# Patient Record
Sex: Female | Born: 1985 | Race: White | Hispanic: No | Marital: Married | Smoking: Never smoker
Health system: Southern US, Community
[De-identification: ages and names within clinical notes are randomized; demographics above are authoritative.]

## PROBLEM LIST (undated history)

## (undated) DIAGNOSIS — Z789 Other specified health status: Secondary | ICD-10-CM

## (undated) DIAGNOSIS — Z9889 Other specified postprocedural states: Secondary | ICD-10-CM

## (undated) HISTORY — PX: NECK SURGERY: SHX720

---

## 2010-11-01 ENCOUNTER — Encounter: Payer: Self-pay | Admitting: Emergency Medicine

## 2010-11-01 ENCOUNTER — Inpatient Hospital Stay (INDEPENDENT_AMBULATORY_CARE_PROVIDER_SITE_OTHER)
Admission: RE | Admit: 2010-11-01 | Discharge: 2010-11-01 | Disposition: A | Discharge status: Home / Self Care | Payer: BC Managed Care – PPO | Source: Ambulatory Visit | Attending: Emergency Medicine | Admitting: Emergency Medicine

## 2010-11-01 DIAGNOSIS — J069 Acute upper respiratory infection, unspecified: Secondary | ICD-10-CM

## 2010-11-01 LAB — CONVERTED CEMR LAB: Rapid Strep: NEGATIVE

## 2011-02-21 ENCOUNTER — Encounter (INDEPENDENT_AMBULATORY_CARE_PROVIDER_SITE_OTHER): Payer: Self-pay | Admitting: *Deleted

## 2011-02-21 ENCOUNTER — Inpatient Hospital Stay
Admission: RE | Admit: 2011-02-21 | Discharge: 2011-02-21 | Disposition: A | Discharge status: Home / Self Care | Payer: BC Managed Care – PPO | Source: Ambulatory Visit | Attending: Family Medicine | Admitting: Family Medicine

## 2011-02-21 LAB — CONVERTED CEMR LAB: Rapid Strep: NEGATIVE

## 2011-05-26 NOTE — Progress Notes (Signed)
Summary: COUGH,CONGESTION,SORE THROAT/TJ rm 5   Vital Signs:  Patient Profile:   25 Years Old Female CC:      Cold & URI symptoms x 5 days Weight:      139.50 pounds O2 Sat:      99 % O2 treatment:    Room Air Temp:     98.5 degrees F oral Pulse rate:   77 / minute Resp:     18 per minute BP sitting:   110 / 72  (left arm) Cuff size:   regular  Vitals Entered By: Clemens Catholic LPN (Nov 01, 2010 5:17 PM)                  Updated Prior Medication List: LOESTRIN 1/20 (21) 1-20 MG-MCG TABS (NORETHINDRONE ACET-ETHINYL EST)   Current Allergies: No known allergies History of Present Illness History from: patient Chief Complaint: Cold & URI symptoms x 5 days History of Present Illness: 25 Years Old Female complains of onset of cold symptoms for 6 days.  Kirsten Odom has been using lots of OTC cough & cold meds which is helping a little bit. +sore throat/hoarseness + cough No pleuritic pain No wheezing + nasal congestion + post-nasal drainage +sinus pain/pressure No chest congestion No itchy/red eyes No earache No hemoptysis No SOB No chills/sweats No fever No nausea No vomiting No abdominal pain No diarrhea No skin rashes + fatigue No myalgias No headache   REVIEW OF SYSTEMS Constitutional Symptoms      Denies fever, chills, night sweats, weight loss, weight gain, and fatigue.  Eyes       Complains of eye pain.      Denies change in vision, eye discharge, glasses, contact lenses, and eye surgery. Ear/Nose/Throat/Mouth       Complains of ear pain, frequent runny nose, sore throat, and hoarseness.      Denies hearing loss/aids, change in hearing, ear discharge, dizziness, frequent nose bleeds, sinus problems, and tooth pain or bleeding.  Respiratory       Complains of dry cough.      Denies productive cough, wheezing, shortness of breath, asthma, bronchitis, and emphysema/COPD.  Cardiovascular       Complains of tires easily with exhertion.      Denies murmurs  and chest pain.    Gastrointestinal       Complains of stomach pain.      Denies nausea/vomiting, diarrhea, constipation, blood in bowel movements, and indigestion. Genitourniary       Denies painful urination, kidney stones, and loss of urinary control. Neurological       Denies paralysis, seizures, and fainting/blackouts. Musculoskeletal       Complains of muscle pain and joint pain.      Denies joint stiffness, decreased range of motion, redness, swelling, muscle weakness, and gout.  Skin       Denies bruising, unusual mles/lumps or sores, and hair/skin or nail changes.  Psych       Denies mood changes, temper/anger issues, anxiety/stress, speech problems, depression, and sleep problems. Other Comments: Pt c/o sore throat, nasal congestion, hoarseness and ears congested x 5 days. no fever.  she has taken dayquil, nyquil, pseudofed,  and IBF.    Past History:  Past Medical History: Unremarkable  Past Surgical History: Denies surgical history  Family History: none  Social History: Never Smoked Alcohol use-no Drug use-no Smoking Status:  never Drug Use:  no Physical Exam General appearance: well developed, well nourished, no acute distress Ears: normal, no  lesions or deformities Nasal: mucosa pink, nonedematous, no septal deviation, turbinates normal Oral/Pharynx: tongue normal, posterior pharynx without erythema or exudate Chest/Lungs: no rales, wheezes, or rhonchi bilateral, breath sounds equal without effort Heart: regular rate and  rhythm, no murmur MSE: oriented to time, place, and person Assessment New Problems: UPPER RESPIRATORY INFECTION, ACUTE (ICD-465.9)   Plan New Medications/Changes: CHERATUSSIN AC 100-10 MG/5ML SYRP (GUAIFENESIN-CODEINE) 5cc q6 hrs as needed for cough  #4oz x 0, 11/01/2010, Hoyt Koch MD PREDNISONE (PAK) 10 MG TABS (PREDNISONE) 6 day pack, use as directed  #1 x 0, 11/01/2010, Hoyt Koch MD  New Orders: New Patient Level  III [phone] Pulse Oximetry (single measurment) [94760] Planning Comments:   1)  No antibiotic since likely viral.  Rapid strep negative.  No culture is done. 2)  Use nasal saline solution (over the counter) at least 3 times a day. 3)  Use over the counter decongestants like Zyrtec-D every 12 hours as needed to help with congestion. 4)  Can take tylenol every 6 hours or motrin every 8 hours for pain or fever. 5)  Follow up with your primary doctor  if no improvement in 5-7 days, sooner if increasing pain, fever, or new symptoms.    The patient and/or caregiver has been counseled thoroughly with regard to medications prescribed including dosage, schedule, interactions, rationale for use, and possible side effects and they verbalize understanding.  Diagnoses and expected course of recovery discussed and will return if not improved as expected or if the condition worsens. Patient and/or caregiver verbalized understanding.  Prescriptions: CHERATUSSIN AC 100-10 MG/5ML SYRP (GUAIFENESIN-CODEINE) 5cc q6 hrs as needed for cough  #4oz x 0   Entered and Authorized by:   Hoyt Koch MD   Signed by:   Hoyt Koch MD on 11/01/2010   Method used:   Print then Give to Patient   RxID:   [id number] PREDNISONE (PAK) 10 MG TABS (PREDNISONE) 6 day pack, use as directed  #1 x 0   Entered and Authorized by:   Hoyt Koch MD   Signed by:   Hoyt Koch MD on 11/01/2010   Method used:   Print then Give to Patient   RxID:   [id number]   Orders Added: 1)  New Patient Level III [29528] 2)  Pulse Oximetry (single measurment) [41324]    Laboratory Results  Date/Time Received: Nov 01, 2010 5:32 PM Date/Time Reported: Nov 01, 2010 5:32 PM   Other Tests  Rapid Strep: negative  Kit Test Internal QC: Negative   (Normal Range: Negative)

## 2011-05-26 NOTE — Assessment & Plan Note (Signed)
Summary: STREP TEST...WSE  Nurse Visit   Vitals Entered By: Clemens Catholic LPN (February 21, 2011 6:44 PM)  Allergies: No Known Drug Allergies Laboratory Results  Date/Time Received: February 21, 2011 6:44 PM  Date/Time Reported: February 21, 2011 6:44 PM   Other Tests  Rapid Strep: negative Comments: The pt came in today requesting a strep test. Advised the pt that her strep test was negative, gave her a copy of OTC med sheet handout, and advised if she fails to improve or worsens she should return to the clinic for an office visit. pt agrees.  Kit Test Internal QC: Negative   (Normal Range: Negative)

## 2014-06-23 NOTE — L&D Delivery Note (Signed)
Delivery Note At 6:46 AM a viable female was delivered via Vaginal, Spontaneous Delivery (Presentation: ;  ).  APGAR: 9, 9; weight  .   Placenta status: Intact, Spontaneous.  Cord: 3 vessels with the following complications: None.  Cord pH: not sent  Anesthesia: Epidural  Episiotomy:  none Lacerations: 2nd degree;Vaginal Suture Repair: 3.0 vicryl rapide Est. Blood Loss (mL): 550     Le lat vag lac + sec deg perineal lac repaired Mom to postpartum.  Baby to Couplet care / Skin to Skin.  Kirsten Odom,Kirsten Odom 04/14/2015, 7:19 AM

## 2014-09-10 ENCOUNTER — Ambulatory Visit (INDEPENDENT_AMBULATORY_CARE_PROVIDER_SITE_OTHER): Payer: BC Managed Care – PPO | Admitting: Family Medicine

## 2014-09-10 VITALS — BP 120/80 | HR 107 | Temp 98.3°F | Resp 16 | Ht 69.0 in | Wt 137.0 lb

## 2014-09-10 DIAGNOSIS — R112 Nausea with vomiting, unspecified: Secondary | ICD-10-CM | POA: Diagnosis not present

## 2014-09-10 DIAGNOSIS — O210 Mild hyperemesis gravidarum: Secondary | ICD-10-CM | POA: Insufficient documentation

## 2014-09-10 MED ORDER — DOXYLAMINE-PYRIDOXINE 10-10 MG PO TBEC: 2.0000 | DELAYED_RELEASE_TABLET | Freq: Every evening | ORAL | Status: DC | PRN

## 2014-09-10 NOTE — Patient Instructions (Signed)

## 2014-09-10 NOTE — Progress Notes (Signed)
Chief Complaint:  Chief Complaint  Patient presents with  . Nausea    Morning sickness    HPI: Kirsten Odom is a 29 y.o. female who is here for  All day morning sickness, she is [redacted] weeks pregnant.  She has tried everything over-the-counter and has googled many different things to see if it would help her with her nausea.  Is unable to see a OB/GYN at this time until [redacted] weeks along.  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 She has tried vitamin b6, ginger, and nothing is working Drinking a lot of fluids. Mostly water she will have gatorade for sugar once in a while. She does not throw up water. Any type of solid food makes her sick. Vomitus about 3-5 times a day.   History reviewed. No pertinent past medical history. History reviewed. No pertinent past surgical history. History   Social History  . Marital Status: Married    Spouse Name: N/A  . Number of Children: N/A  . Years of Education: N/A   Social History Main Topics  . Smoking status: Never Smoker   . Smokeless tobacco: Not on file  . Alcohol Use: Not on file  . Drug Use: Not on file  . Sexual Activity: Not on file   Other Topics Concern  . None   Social History Narrative  . None   Family History  Problem Relation Age of Onset  . Hyperlipidemia Father   . Stroke Maternal Grandmother   . Stroke Maternal Grandfather   . Heart disease Maternal Grandfather   . Diabetes Maternal Grandfather    No Known Allergies Prior to Admission medications   Medication Sig Start Date End Date Taking? Authorizing Provider  Prenatal Multivit-Min-Fe-FA (PRENATAL VITAMINS PO) Take by mouth.   Yes Historical Provider, MD     ROS: The patient denies fevers, chills, night sweats, unintentional weight loss, chest pain, palpitations, wheezing, dyspnea on exertion, abdominal pain, dysuria,  hematuria, melena, numbness, weakness, or tingling.   All other systems have been reviewed and were otherwise negative with the exception of those mentioned in the HPI and as above.    PHYSICAL EXAM: Filed Vitals:   09/10/14 1543  BP: 120/80  Pulse: 107  Temp: 98.3 F (36.8 C)  Resp: 16   Filed Vitals:   09/10/14 1543  Height: 5\' 9"  (1.753 m)  Weight: 137 lb (62.143 kg)   Body mass index is 20.22 kg/(m^2).  General: Alert, no acute distress HEENT:  Normocephalic, atraumatic, oropharynx patent. EOMI, PERRLA Cardiovascular:  Regular rate and rhythm, no rubs murmurs or gallops.  No Carotid bruits, radial pulse intact. No pedal edema.  Respiratory: Clear to auscultation bilaterally.  No wheezes, rales, or rhonchi.  No cyanosis, no use of accessory musculature GI: No organomegaly, abdomen is soft and non-tender, positive bowel sounds.  No masses. Skin: No rashes. Neurologic: Facial musculature symmetric. Psychiatric: Patient is appropriate throughout our interaction. Lymphatic: No cervical lymphadenopathy Musculoskeletal: Gait intact.   LABS: Results for orders placed or performed in visit on 02/21/11  [facility]  Result Value Ref Range   Rapid Strep negative      EKG/XRAY:   Primary [facility]   ASSESSMENT/PLAN: Encounter Diagnoses  Name Primary?  . Morning sickness Yes  .  Nausea and vomiting, vomiting of unspecified type    29 year old female G1 A0 L0 is 7 weeks and 0 days pregnant. She has nausea and vomiting. His tried everything over-the-counter. Prescribe Diclegis for when necessary use, we went over the side effect profiles of the medication and the pregnancy category.  Continue to push fluids. Follow-up with OB/GYN   Gross sideeffects, risk and benefits, and alternatives of medications d/w patient. Patient is aware that all medications have potential sideeffects and we are unable to predict every sideeffect or drug-drug  interaction that may occur.  Kirsten Capri PHUONG, DO 09/10/2014 5:55 PM

## 2014-10-03 LAB — OB RESULTS CONSOLE HIV ANTIBODY (ROUTINE TESTING): HIV: NONREACTIVE

## 2014-10-03 LAB — OB RESULTS CONSOLE RPR: RPR: NONREACTIVE

## 2014-10-03 LAB — OB RESULTS CONSOLE GC/CHLAMYDIA
Chlamydia: NEGATIVE
GC PROBE AMP, GENITAL: NEGATIVE

## 2014-10-03 LAB — OB RESULTS CONSOLE HEPATITIS B SURFACE ANTIGEN: Hepatitis B Surface Ag: NEGATIVE

## 2014-10-03 LAB — OB RESULTS CONSOLE RUBELLA ANTIBODY, IGM: Rubella: IMMUNE

## 2015-03-28 LAB — OB RESULTS CONSOLE GBS
GBS: NEGATIVE
GBS: NEGATIVE
STREP GROUP B AG: POSITIVE
STREP GROUP B AG: NEGATIVE

## 2015-04-14 ENCOUNTER — Encounter (HOSPITAL_COMMUNITY): Payer: Self-pay | Admitting: Anesthesiology

## 2015-04-14 ENCOUNTER — Inpatient Hospital Stay (HOSPITAL_COMMUNITY): Payer: BC Managed Care – PPO | Admitting: Anesthesiology

## 2015-04-14 ENCOUNTER — Encounter (HOSPITAL_COMMUNITY): Payer: Self-pay | Admitting: *Deleted

## 2015-04-14 ENCOUNTER — Inpatient Hospital Stay (HOSPITAL_COMMUNITY)
Admission: AD | Admit: 2015-04-14 | Discharge: 2015-04-16 | DRG: 775 | Disposition: A | Discharge status: Home / Self Care | Payer: BC Managed Care – PPO | Source: Ambulatory Visit | Attending: Obstetrics and Gynecology | Admitting: Obstetrics and Gynecology

## 2015-04-14 DIAGNOSIS — Z8249 Family history of ischemic heart disease and other diseases of the circulatory system: Secondary | ICD-10-CM | POA: Diagnosis not present

## 2015-04-14 DIAGNOSIS — Z833 Family history of diabetes mellitus: Secondary | ICD-10-CM

## 2015-04-14 DIAGNOSIS — Z823 Family history of stroke: Secondary | ICD-10-CM

## 2015-04-14 DIAGNOSIS — K219 Gastro-esophageal reflux disease without esophagitis: Secondary | ICD-10-CM | POA: Diagnosis present

## 2015-04-14 DIAGNOSIS — Z3A38 38 weeks gestation of pregnancy: Secondary | ICD-10-CM | POA: Diagnosis not present

## 2015-04-14 DIAGNOSIS — O9962 Diseases of the digestive system complicating childbirth: Secondary | ICD-10-CM | POA: Diagnosis present

## 2015-04-14 HISTORY — DX: Other specified health status: Z78.9

## 2015-04-14 HISTORY — DX: Other specified postprocedural states: Z98.890

## 2015-04-14 LAB — CBC
HCT: 40 % (ref 36.0–46.0)
Hemoglobin: 14.2 g/dL (ref 12.0–15.0)
MCH: 31 pg (ref 26.0–34.0)
MCHC: 35.5 g/dL (ref 30.0–36.0)
MCV: 87.3 fL (ref 78.0–100.0)
PLATELETS: 187 10*3/uL (ref 150–400)
RBC: 4.58 MIL/uL (ref 3.87–5.11)
RDW: 13.6 % (ref 11.5–15.5)
WBC: 11.2 10*3/uL — ABNORMAL HIGH (ref 4.0–10.5)

## 2015-04-14 LAB — TYPE AND SCREEN
ABO/RH(D): A POS
ANTIBODY SCREEN: NEGATIVE

## 2015-04-14 LAB — ABO/RH: ABO/RH(D): A POS

## 2015-04-14 LAB — OB RESULTS CONSOLE GBS: GBS: NEGATIVE

## 2015-04-14 LAB — RPR: RPR Ser Ql: NONREACTIVE

## 2015-04-14 LAB — POCT FERN TEST: POCT Fern Test: POSITIVE

## 2015-04-14 MED ORDER — IBUPROFEN 800 MG PO TABS
800.0000 mg | ORAL_TABLET | Freq: Three times a day (TID) | ORAL | Status: DC | PRN
  Administered 2015-04-14 – 2015-04-16 (×6): 800 mg via ORAL
  Filled 2015-04-14 (×6): qty 1

## 2015-04-14 MED ORDER — DIPHENHYDRAMINE HCL 50 MG/ML IJ SOLN: 12.5000 mg | INTRAMUSCULAR | Status: DC | PRN

## 2015-04-14 MED ORDER — CITRIC ACID-SODIUM CITRATE 334-500 MG/5ML PO SOLN: 30.0000 mL | ORAL | Status: DC | PRN

## 2015-04-14 MED ORDER — FLEET ENEMA 7-19 GM/118ML RE ENEM: 1.0000 | ENEMA | RECTAL | Status: DC | PRN

## 2015-04-14 MED ORDER — OXYCODONE-ACETAMINOPHEN 5-325 MG PO TABS: 2.0000 | ORAL_TABLET | ORAL | Status: DC | PRN

## 2015-04-14 MED ORDER — LIDOCAINE HCL (PF) 1 % IJ SOLN
30.0000 mL | INTRAMUSCULAR | Status: DC | PRN
  Filled 2015-04-14: qty 30

## 2015-04-14 MED ORDER — SODIUM BICARBONATE 8.4 % IV SOLN
INTRAVENOUS | Status: DC | PRN
  Administered 2015-04-14: 5 mL via EPIDURAL

## 2015-04-14 MED ORDER — OXYTOCIN BOLUS FROM INFUSION
500.0000 mL | INTRAVENOUS | Status: DC
  Administered 2015-04-14: 500 mL via INTRAVENOUS

## 2015-04-14 MED ORDER — OXYCODONE-ACETAMINOPHEN 5-325 MG PO TABS
1.0000 | ORAL_TABLET | ORAL | Status: DC | PRN
  Administered 2015-04-14 – 2015-04-16 (×9): 1 via ORAL
  Filled 2015-04-14 (×9): qty 1

## 2015-04-14 MED ORDER — WITCH HAZEL-GLYCERIN EX PADS
1.0000 "application " | MEDICATED_PAD | CUTANEOUS | Status: DC | PRN
  Administered 2015-04-14 – 2015-04-15 (×2): 1 via TOPICAL

## 2015-04-14 MED ORDER — FENTANYL 2.5 MCG/ML BUPIVACAINE 1/10 % EPIDURAL INFUSION (WH - ANES)
14.0000 mL/h | INTRAMUSCULAR | Status: DC | PRN
  Administered 2015-04-14: 14 mL/h via EPIDURAL

## 2015-04-14 MED ORDER — LACTATED RINGERS IV SOLN
INTRAVENOUS | Status: DC
  Administered 2015-04-14: 05:00:00 via INTRAVENOUS

## 2015-04-14 MED ORDER — PHENYLEPHRINE 40 MCG/ML (10ML) SYRINGE FOR IV PUSH (FOR BLOOD PRESSURE SUPPORT)
PREFILLED_SYRINGE | INTRAVENOUS | Status: AC
  Filled 2015-04-14: qty 20

## 2015-04-14 MED ORDER — OXYTOCIN 40 UNITS IN LACTATED RINGERS INFUSION - SIMPLE MED
62.5000 mL/h | INTRAVENOUS | Status: DC
  Filled 2015-04-14: qty 1000

## 2015-04-14 MED ORDER — LANOLIN HYDROUS EX OINT: TOPICAL_OINTMENT | CUTANEOUS | Status: DC | PRN

## 2015-04-14 MED ORDER — DIPHENHYDRAMINE HCL 25 MG PO CAPS: 25.0000 mg | ORAL_CAPSULE | Freq: Four times a day (QID) | ORAL | Status: DC | PRN

## 2015-04-14 MED ORDER — SIMETHICONE 80 MG PO CHEW: 80.0000 mg | CHEWABLE_TABLET | ORAL | Status: DC | PRN

## 2015-04-14 MED ORDER — EPHEDRINE 5 MG/ML INJ
10.0000 mg | INTRAVENOUS | Status: DC | PRN
  Filled 2015-04-14: qty 2

## 2015-04-14 MED ORDER — LACTATED RINGERS IV SOLN
500.0000 mL | INTRAVENOUS | Status: DC | PRN
  Administered 2015-04-14: 1000 mL via INTRAVENOUS
  Administered 2015-04-14: 500 mL via INTRAVENOUS

## 2015-04-14 MED ORDER — ACETAMINOPHEN 325 MG PO TABS: 650.0000 mg | ORAL_TABLET | ORAL | Status: DC | PRN

## 2015-04-14 MED ORDER — ONDANSETRON HCL 4 MG/2ML IJ SOLN: 4.0000 mg | INTRAMUSCULAR | Status: DC | PRN

## 2015-04-14 MED ORDER — OXYCODONE-ACETAMINOPHEN 5-325 MG PO TABS: 1.0000 | ORAL_TABLET | ORAL | Status: DC | PRN

## 2015-04-14 MED ORDER — FLEET ENEMA 7-19 GM/118ML RE ENEM: 1.0000 | ENEMA | Freq: Every day | RECTAL | Status: DC | PRN

## 2015-04-14 MED ORDER — ONDANSETRON HCL 4 MG PO TABS: 4.0000 mg | ORAL_TABLET | ORAL | Status: DC | PRN

## 2015-04-14 MED ORDER — ONDANSETRON HCL 4 MG/2ML IJ SOLN
4.0000 mg | Freq: Four times a day (QID) | INTRAMUSCULAR | Status: DC | PRN
  Administered 2015-04-14: 4 mg via INTRAVENOUS
  Filled 2015-04-14: qty 2

## 2015-04-14 MED ORDER — TETANUS-DIPHTH-ACELL PERTUSSIS 5-2.5-18.5 LF-MCG/0.5 IM SUSP: 0.5000 mL | Freq: Once | INTRAMUSCULAR | Status: DC

## 2015-04-14 MED ORDER — PRENATAL MULTIVITAMIN CH
1.0000 | ORAL_TABLET | Freq: Every day | ORAL | Status: DC
  Administered 2015-04-14 – 2015-04-15 (×2): 1 via ORAL
  Filled 2015-04-14 (×2): qty 1

## 2015-04-14 MED ORDER — PHENYLEPHRINE 40 MCG/ML (10ML) SYRINGE FOR IV PUSH (FOR BLOOD PRESSURE SUPPORT)
80.0000 ug | PREFILLED_SYRINGE | INTRAVENOUS | Status: DC | PRN
  Administered 2015-04-14: 40 ug via INTRAVENOUS
  Filled 2015-04-14: qty 2

## 2015-04-14 MED ORDER — BISACODYL 10 MG RE SUPP: 10.0000 mg | Freq: Every day | RECTAL | Status: DC | PRN

## 2015-04-14 MED ORDER — ZOLPIDEM TARTRATE 5 MG PO TABS: 5.0000 mg | ORAL_TABLET | Freq: Every evening | ORAL | Status: DC | PRN

## 2015-04-14 MED ORDER — SENNOSIDES-DOCUSATE SODIUM 8.6-50 MG PO TABS
2.0000 | ORAL_TABLET | ORAL | Status: DC
Start: 2015-04-15 — End: 2015-04-16
  Administered 2015-04-15 – 2015-04-16 (×2): 2 via ORAL
  Filled 2015-04-14 (×2): qty 2

## 2015-04-14 MED ORDER — MEASLES, MUMPS & RUBELLA VAC ~~LOC~~ INJ: 0.5000 mL | INJECTION | Freq: Once | SUBCUTANEOUS | Status: DC

## 2015-04-14 MED ORDER — FENTANYL 2.5 MCG/ML BUPIVACAINE 1/10 % EPIDURAL INFUSION (WH - ANES)
INTRAMUSCULAR | Status: AC
  Administered 2015-04-14: 05:00:00
  Filled 2015-04-14: qty 125

## 2015-04-14 MED ORDER — LIDOCAINE HCL (PF) 1 % IJ SOLN
INTRAMUSCULAR | Status: DC | PRN
  Administered 2015-04-14 (×2): 4 mL via EPIDURAL

## 2015-04-14 MED ORDER — DIBUCAINE 1 % RE OINT
1.0000 "application " | TOPICAL_OINTMENT | RECTAL | Status: DC | PRN
  Administered 2015-04-15: 1 via RECTAL
  Filled 2015-04-14: qty 28

## 2015-04-14 MED ORDER — BENZOCAINE-MENTHOL 20-0.5 % EX AERO
1.0000 "application " | INHALATION_SPRAY | CUTANEOUS | Status: DC | PRN
  Administered 2015-04-14: 1 via TOPICAL
  Filled 2015-04-14: qty 56

## 2015-04-14 NOTE — Anesthesia Procedure Notes (Signed)
Epidural Patient location during procedure: OB Start time: 04/14/2015 4:38 AM  Staffing Anesthesiologist: Mal AmabileFOSTER, Branston Halsted  Preanesthetic Checklist Completed: patient identified, site marked, surgical consent, pre-op evaluation, timeout performed, IV checked, risks and benefits discussed and monitors and equipment checked  Epidural Patient position: sitting Prep: site prepped and draped and DuraPrep Patient monitoring: continuous pulse ox and blood pressure Approach: midline Location: L4-L5 Injection technique: LOR air  Needle:  Needle type: Tuohy  Needle gauge: 17 G Needle length: 9 cm and 9 Needle insertion depth: 4 cm Catheter type: closed end flexible Catheter size: 19 Gauge Catheter at skin depth: 10 cm Test dose: negative  Assessment Events: blood not aspirated, injection not painful, no injection resistance, negative IV test and no paresthesia  Additional Notes Patient identified. Risks and benefits discussed including failed block, incomplete  Pain control, post dural puncture headache, nerve damage, paralysis, blood pressure Changes, nausea, vomiting, reactions to medications-both toxic and allergic and post Partum back pain. All questions were answered. Patient expressed understanding and wished to proceed. Sterile technique was used throughout procedure. Epidural site was Dressed with sterile barrier dressing. No paresthesias, signs of intravascular injection Or signs of intrathecal spread were encountered.  Patient was more comfortable after the epidural was dosed. Please see RN's note for documentation of vital signs and FHR which are stable.

## 2015-04-14 NOTE — Anesthesia Postprocedure Evaluation (Signed)
  Anesthesia Post-op Note  Patient: Kirsten Odom  Procedure(s) Performed: * No procedures listed *  Patient Location: Mother/Baby  Anesthesia Type:Epidural  Level of Consciousness: awake and alert   Airway and Oxygen Therapy: Patient Spontanous Breathing  Post-op Pain: mild  Post-op Assessment: Post-op Vital signs reviewed, Patient's Cardiovascular Status Stable, Respiratory Function Stable, No signs of Nausea or vomiting, Pain level controlled, No headache, Spinal receding and Patient able to bend at knees              Post-op Vital Signs: Reviewed  Last Vitals:  Filed Vitals:   04/14/15 1353  BP: 109/55  Pulse: 79  Temp: 37.2 C  Resp: 20    Complications: No apparent anesthesia complications

## 2015-04-14 NOTE — Lactation Note (Signed)
This note was copied from the chart of [name]. Lactation Consultation Note  Assisted with laid back BF and mom reported this as the deepest latch since birth.  Baby ate well. Mom taught hand expression.  Aware of support groups and outpatient services.  Patient Name: [name] [id number]'s Date: 04/14/2015 Reason for consult: Initial assessment   Maternal Data Has patient been taught Hand Expression?: Yes Does the patient have breastfeeding experience prior to this delivery?: No  Feeding Feeding Type: Breast Fed Length of feed: 15 min  LATCH Score/Interventions Latch: Grasps breast easily, tongue down, lips flanged, rhythmical sucking.  Audible Swallowing: A few with stimulation  Type of Nipple: Everted at rest and after stimulation  Comfort (Breast/Nipple): Soft / non-tender     Hold (Positioning): Assistance needed to correctly position infant at breast and maintain latch.  LATCH Score: 8  Lactation Tools Discussed/Used     Consult Status Consult Status: Follow-up Date: 04/15/15 Follow-up type: In-patient    Soyla DryerJoseph, Kirsten Malay 04/14/2015, 3:46 PM

## 2015-04-14 NOTE — Progress Notes (Signed)
Dr Marcelle OverlieHolland notified of pt's admission and status. Aware of ctx pattern, sve, reactive FHR. [facility]

## 2015-04-14 NOTE — H&P (Signed)
Kirsten RiegerMeredith H Odom is a 29 y.o. female presenting for SROM + labor. Maternal Medical History:  Reason for admission: Rupture of membranes and contractions.   Contractions: Onset was 3-5 hours ago.   Frequency: regular.    Fetal activity: Perceived fetal activity is normal.      OB History    Gravida Para Term Preterm AB TAB SAB Ectopic Multiple Living   1              Past Medical History  Diagnosis Date  . Medical history non-contributory    Past Surgical History  Procedure Laterality Date  . Neck surgery     Family History: family history includes Diabetes in her maternal grandfather; Heart disease in her maternal grandfather; Hyperlipidemia in her father; Stroke in her maternal grandfather and maternal grandmother. Social History:  reports that she has never smoked. She does not have any smokeless tobacco history on file. She reports that she does not drink alcohol or use illicit drugs.   Prenatal Transfer Tool  Maternal Diabetes: No Genetic Screening: Normal Maternal Ultrasounds/Referrals: Normal Fetal Ultrasounds or other Referrals:  None Maternal Substance Abuse:  No Significant Maternal Medications:  None Significant Maternal Lab Results:  None Other Comments:  None  ROS  Dilation: 10 Effacement (%): 100 Station: +1 Exam by:: Veronica Mensah Blood pressure 116/87, pulse 107, temperature 98.5 F (36.9 C), temperature source Oral, resp. rate 20, height 5\' 8"  (1.727 Odom), weight 151 lb 12.8 oz (68.856 kg), last menstrual period 07/26/2014, SpO2 99 %. Exam Physical Exam  Constitutional: She is oriented to person, place, and time. She appears well-developed and well-nourished.  HENT:  Head: Normocephalic and atraumatic.  Neck: Normal range of motion. Neck supple.  Cardiovascular: Normal rate and regular rhythm.   Respiratory: Effort normal and breath sounds normal.  GI:  Term FH/FHR 142  Genitourinary:  Clear AF/8.5cm  Musculoskeletal: Normal range of  motion.  Neurological: She is alert and oriented to person, place, and time.    Prenatal labs: ABO, Rh: --/--/A POS (10/22 0355) Antibody: NEG (10/22 0355) Rubella: Immune (04/12 0000) RPR: Nonreactive (04/12 0000)  HBsAg: Negative (04/12 0000)  HIV: Non-reactive (04/12 0000)  GBS: Negative (10/22 0000)   Assessment/Plan: Term IUP/labor   Kirsten Odom,Kirsten Odom 04/14/2015, 5:19 AM

## 2015-04-14 NOTE — Progress Notes (Signed)
Patient unable to void, will monitor.

## 2015-04-14 NOTE — Anesthesia Preprocedure Evaluation (Signed)
Anesthesia Evaluation  Patient identified by MRN, date of birth, ID band Patient awake    Reviewed: Allergy & Precautions, H&P , Patient's Chart, lab work & pertinent test results  Airway Mallampati: II  TM Distance: >3 FB Neck ROM: full    Dental no notable dental hx. (+) Teeth Intact   Pulmonary neg pulmonary ROS,    Pulmonary exam normal breath sounds clear to auscultation       Cardiovascular negative cardio ROS Normal cardiovascular exam Rhythm:regular Rate:Normal     Neuro/Psych negative neurological ROS  negative psych ROS   GI/Hepatic negative GI ROS, Neg liver ROS, GERD  ,  Endo/Other  negative endocrine ROS  Renal/GU negative Renal ROS  negative genitourinary   Musculoskeletal   Abdominal   Peds  Hematology negative hematology ROS (+)   Anesthesia Other Findings   Reproductive/Obstetrics (+) Pregnancy                             Anesthesia Physical Anesthesia Plan  ASA: II  Anesthesia Plan: Epidural   Post-op Pain Management:    Induction:   Airway Management Planned:   Additional Equipment:   Intra-op Plan:   Post-operative Plan:   Informed Consent: I have reviewed the patients History and Physical, chart, labs and discussed the procedure including the risks, benefits and alternatives for the proposed anesthesia with the patient or authorized representative who has indicated his/her understanding and acceptance.     Plan Discussed with: Anesthesiologist  Anesthesia Plan Comments:         Anesthesia Quick Evaluation

## 2015-04-14 NOTE — MAU Note (Signed)
Contractions since 0145. SROM at 0100. Continue to leak clear fld.  4cm last sve

## 2015-04-14 NOTE — Progress Notes (Signed)
Pt vomited at 0351

## 2015-04-15 LAB — CBC
HCT: 35.6 % — ABNORMAL LOW (ref 36.0–46.0)
HEMOGLOBIN: 12.2 g/dL (ref 12.0–15.0)
MCH: 30.3 pg (ref 26.0–34.0)
MCHC: 34.3 g/dL (ref 30.0–36.0)
MCV: 88.6 fL (ref 78.0–100.0)
PLATELETS: 179 10*3/uL (ref 150–400)
RBC: 4.02 MIL/uL (ref 3.87–5.11)
RDW: 13.7 % (ref 11.5–15.5)
WBC: 12.7 10*3/uL — ABNORMAL HIGH (ref 4.0–10.5)

## 2015-04-15 NOTE — Lactation Note (Signed)
This note was copied from the chart of Kirsten Launa FlightMeredith Baehr. Lactation Consultation Note  Patient Name: Kirsten Odom ZOXWR'UToday's Date: 04/15/2015 Reason for consult: Follow-up assessment Baby at 37 hr and mom requesting help with a deep latch. Demonstrated how to hold baby up close, maintain control over baby's head/kneck, and squish the breast so that baby can easily grab more. She know how to pull the lips out and thread more breast into the baby's mouth once latched. Her nipples appear red but no skin break down at this time. She will call as needed for bf help.   Maternal Data    Feeding Feeding Type: Breast Fed Length of feed: 15 min  LATCH Score/Interventions                      Lactation Tools Discussed/Used     Consult Status Consult Status: Follow-up Date: 04/16/15 Follow-up type: In-patient    [name] 04/15/2015, 8:36 PM

## 2015-04-15 NOTE — Progress Notes (Signed)
Post Partum Day 1 Subjective: no complaints  Objective: Blood pressure 108/65, pulse 85, temperature 98.6 F (37 C), temperature source Oral, resp. rate 18, height 5\' 8"  (1.727 m), weight 151 lb 12.8 oz (68.856 kg), last menstrual period 07/26/2014, SpO2 98 %, unknown if currently breastfeeding.  Physical Exam:  General: alert Lochia: appropriate Uterine Fundus: firm Incision: healing well DVT Evaluation: No evidence of DVT seen on physical exam.   Recent Labs  04/14/15 0355 04/15/15 0600  HGB 14.2 12.2  HCT 40.0 35.6*    Assessment/Plan: Plan for discharge tomorrow   LOS: 1 day   Ernesha Ramone M 04/15/2015, 10:34 AM

## 2015-04-16 MED ORDER — IBUPROFEN 800 MG PO TABS: 800.0000 mg | ORAL_TABLET | Freq: Three times a day (TID) | ORAL | Status: DC | PRN

## 2015-04-16 MED ORDER — OXYCODONE-ACETAMINOPHEN 5-325 MG PO TABS: 1.0000 | ORAL_TABLET | ORAL | Status: DC | PRN

## 2015-04-16 NOTE — Discharge Summary (Signed)
Obstetric Discharge Summary Reason for Admission: onset of labor Prenatal Procedures: ultrasound Intrapartum Procedures: spontaneous vaginal delivery Postpartum Procedures: none Complications-Operative and Postpartum: 2 degree perineal laceration HEMOGLOBIN  Date Value Ref Range Status  04/15/2015 12.2 12.0 - 15.0 g/dL Final   HCT  Date Value Ref Range Status  04/15/2015 35.6* 36.0 - 46.0 % Final    Physical Exam:  General: alert and cooperative Lochia: appropriate Uterine Fundus: firm Incision: healing well DVT Evaluation: No evidence of DVT seen on physical exam. Negative Homan's sign. No cords or calf tenderness. No significant calf/ankle edema.  Discharge Diagnoses: Term Pregnancy-delivered  Discharge Information: Date: 04/16/2015 Activity: pelvic rest Diet: routine Medications: PNV, Ibuprofen and Percocet Condition: stable Instructions: refer to practice specific booklet Discharge to: home   Newborn Data: Live born female  Birth Weight: 7 lb 6.7 oz (3365 g) APGAR: 9, 9  Home with mother.  CURTIS,CAROL G 04/16/2015, 8:13 AM

## 2015-04-16 NOTE — Lactation Note (Signed)
This note was copied from the chart of Kirsten Kirsten Odom. Lactation Consultation Note; Mom reports baby has just finished nursing for 25 min. Off to sleep and would not latch to other breast. Reports nipples are sore, slightly red and raw on tips. Reviewed wide open mouth and getting a good deep latch. Reports breasts are feeling fuller this morning. Has comfort gels at home and plans to use them. Has called insurance company about DEBP . Manual pump given with instructions for use and cleaning. Reviewed engorgement prevention and treatment. No questions at present. Reviewed OP appointments and BFSG as resources for support after DC. To call prn  Patient Name: Kirsten Odom WUJWJ'XToday's Date: 04/16/2015 Reason for consult: Follow-up assessment   Maternal Data Formula Feeding for Exclusion: No Has patient been taught Hand Expression?: Yes Does the patient have breastfeeding experience prior to this delivery?: No  Feeding Feeding Type: Breast Fed Length of feed: 12 min  LATCH Score/Interventions                      Lactation Tools Discussed/Used WIC Program: No Pump Review: Setup, frequency, and cleaning Initiated by:: DW Date initiated:: 04/16/15   Consult Status Consult Status: Complete    Pamelia HoitWeeks, Thadd Apuzzo D 04/16/2015, 9:30 AM

## 2015-04-16 NOTE — Lactation Note (Signed)
This note was copied from the chart of Kirsten Launa FlightMeredith Uhrich. Lactation Consultation Note Baby had 9% weight loss, at 43 hrs. Long breast feedings, 12 stools, 7 voids, 2 emesis. I feel that most of weight loss is d/t output. Encouraged RN to montior I&O closely. Patient Name: Kirsten Odom     Maternal Data    Feeding Feeding Type: Breast Fed  LATCH Score/Interventions                      Lactation Tools Discussed/Used     Consult Status      [name], 8:11 AM

## 2015-07-25 ENCOUNTER — Ambulatory Visit (INDEPENDENT_AMBULATORY_CARE_PROVIDER_SITE_OTHER): Payer: BC Managed Care – PPO | Admitting: Physician Assistant

## 2015-07-25 VITALS — BP 118/70 | HR 102 | Temp 99.3°F | Resp 18 | Ht 69.0 in | Wt 130.0 lb

## 2015-07-25 DIAGNOSIS — J029 Acute pharyngitis, unspecified: Secondary | ICD-10-CM | POA: Diagnosis not present

## 2015-07-25 DIAGNOSIS — J039 Acute tonsillitis, unspecified: Secondary | ICD-10-CM

## 2015-07-25 LAB — POCT RAPID STREP A (OFFICE): RAPID STREP A SCREEN: NEGATIVE

## 2015-07-25 MED ORDER — AMOXICILLIN 875 MG PO TABS: 875.0000 mg | ORAL_TABLET | Freq: Two times a day (BID) | ORAL | Status: DC

## 2015-07-25 NOTE — Progress Notes (Signed)
07/28/2015 8:37 AM   DOB: 03/01/1986 / MRN: [id number]  SUBJECTIVE:  Kirsten Odom is a 30 y.o. female presenting for sore throat.  This started MOnday mornibg.  Patient complains of fever Tmax 102 at on Monday. She is taking Ibuprofen 600-800 mg qd for pain and fever with good relief. She is breast feeding.    She has No Known Allergies.   She  has a past medical history of Medical history non-contributory and neck surgery.    She  reports that she has never smoked. She does not have any smokeless tobacco history on file. She reports that she does not drink alcohol or use illicit drugs. She  reports that she currently engages in sexual activity. The patient  has past surgical history that includes Neck surgery.  Her family history includes Diabetes in her maternal grandfather; Heart disease in her maternal grandfather; Hyperlipidemia in her father; Stroke in her maternal grandfather and maternal grandmother.  Review of Systems  Constitutional: Positive for fever and chills.  Eyes: Negative for blurred vision.  Respiratory: Negative for cough and shortness of breath.   Cardiovascular: Negative for chest pain.  Gastrointestinal: Negative for nausea and abdominal pain.  Genitourinary: Negative for dysuria, urgency and frequency.  Musculoskeletal: Negative for myalgias.  Skin: Negative for rash.  Neurological: Negative for dizziness, tingling and headaches.  Psychiatric/Behavioral: Negative for depression. The patient is not nervous/anxious.     Problem list and medications reviewed and updated by myself where necessary, and exist elsewhere in the encounter.   OBJECTIVE:  BP 118/70 mmHg  Pulse 102  Temp(Src) 99.3 F (37.4 C) (Oral)  Resp 18  Ht  (1.753 m)  Wt 130 lb (58.968 kg)  BMI 19.19 kg/m2  SpO2 98%  Breastfeeding? Yes  Physical Exam  Constitutional: She is oriented to person, place, and time. She appears well-nourished. No distress.  Eyes: EOM are normal.  Pupils are equal, round, and reactive to light.  Cardiovascular: Regular rhythm and normal heart sounds.   Pulmonary/Chest: Effort normal.  Abdominal: She exhibits no distension.  Neurological: She is alert and oriented to person, place, and time. No cranial nerve deficit. Gait normal.  Skin: Skin is dry. She is not diaphoretic.  Psychiatric: She has a normal mood and affect.  Vitals reviewed.   Results for orders placed or performed in visit on 07/25/15 (from the past 72 hour(s))  POCT rapid strep A     Status: None   Collection Time: 07/25/15  7:50 PM  Result Value Ref Range   Rapid Strep A Screen Negative Negative  Culture, Group A Strep     Status: None   Collection Time: 07/25/15  7:50 PM  Result Value Ref Range   Organism ID, Bacteria Normal Upper Respiratory Flora     No results found.  ASSESSMENT AND PLAN  Demarie was seen today for sore throat and fever.  Diagnoses and all orders for this visit:  Sore throat -     POCT rapid strep A -     Culture, Group A Strep  Exudative tonsillitis: Will treat empirically.   -     amoxicillin (AMOXIL) 875 MG tablet; Take 1 tablet (875 mg total) by mouth 2 (two) times daily.    The patient was advised to call or return to clinic if she does not see an improvement in symptoms or to seek the care of the closest emergency department if she worsens with the above plan.   Deliah Boston,  MHS, PA-C [facility] 07/28/2015 8:37 AM

## 2015-07-27 LAB — CULTURE, GROUP A STREP: ORGANISM ID, BACTERIA: NORMAL

## 2015-11-14 ENCOUNTER — Ambulatory Visit (INDEPENDENT_AMBULATORY_CARE_PROVIDER_SITE_OTHER): Payer: BC Managed Care – PPO | Admitting: Urgent Care

## 2015-11-14 VITALS — BP 122/72 | HR 142 | Temp 103.0°F | Resp 17 | Ht 68.5 in | Wt 136.0 lb

## 2015-11-14 DIAGNOSIS — R509 Fever, unspecified: Secondary | ICD-10-CM | POA: Diagnosis not present

## 2015-11-14 DIAGNOSIS — J029 Acute pharyngitis, unspecified: Secondary | ICD-10-CM | POA: Diagnosis not present

## 2015-11-14 DIAGNOSIS — R52 Pain, unspecified: Secondary | ICD-10-CM

## 2015-11-14 DIAGNOSIS — R51 Headache: Secondary | ICD-10-CM

## 2015-11-14 DIAGNOSIS — R519 Headache, unspecified: Secondary | ICD-10-CM

## 2015-11-14 DIAGNOSIS — R42 Dizziness and giddiness: Secondary | ICD-10-CM | POA: Diagnosis not present

## 2015-11-14 LAB — POCT CBC
Granulocyte percent: 85.7 %G — AB (ref 37–80)
HEMATOCRIT: 38 % (ref 37.7–47.9)
HEMOGLOBIN: 13.4 g/dL (ref 12.2–16.2)
LYMPH, POC: 0.7 (ref 0.6–3.4)
MCH, POC: 27.7 pg (ref 27–31.2)
MCHC: 35.4 g/dL (ref 31.8–35.4)
MCV: 78.3 fL — AB (ref 80–97)
MID (cbc): 0.5 (ref 0–0.9)
MPV: 7.2 fL (ref 0–99.8)
POC GRANULOCYTE: 7.2 — AB (ref 2–6.9)
POC LYMPH %: 8.3 % — AB (ref 10–50)
POC MID %: 6 %M (ref 0–12)
Platelet Count, POC: 157 10*3/uL (ref 142–424)
RBC: 4.85 M/uL (ref 4.04–5.48)
RDW, POC: 13.4 %
WBC: 8.4 10*3/uL (ref 4.6–10.2)

## 2015-11-14 LAB — POCT URINALYSIS DIP (MANUAL ENTRY)
BILIRUBIN UA: NEGATIVE
Bilirubin, UA: NEGATIVE
Glucose, UA: NEGATIVE
LEUKOCYTES UA: NEGATIVE
NITRITE UA: NEGATIVE
PH UA: 7
PROTEIN UA: NEGATIVE
Spec Grav, UA: <=1.005
UROBILINOGEN UA: 0.2

## 2015-11-14 LAB — POCT RAPID STREP A (OFFICE): RAPID STREP A SCREEN: NEGATIVE

## 2015-11-14 LAB — POCT INFLUENZA A/B
INFLUENZA A, POC: NEGATIVE
INFLUENZA B, POC: NEGATIVE

## 2015-11-14 LAB — POC MICROSCOPIC URINALYSIS (UMFC): MUCUS RE: ABSENT

## 2015-11-14 MED ORDER — ACETAMINOPHEN 325 MG PO TABS
500.0000 mg | ORAL_TABLET | Freq: Once | ORAL | Status: AC
  Administered 2015-11-14: 487.5 mg via ORAL

## 2015-11-14 MED ORDER — IBUPROFEN 200 MG PO TABS
600.0000 mg | ORAL_TABLET | Freq: Once | ORAL | Status: AC
  Administered 2015-11-14: 600 mg via ORAL

## 2015-11-14 NOTE — Progress Notes (Signed)
MRN: [id number] DOB: [birth year]  Subjective:   JOLEE CRITCHER is a 30 y.o. female presenting for chief complaint of Nausea; Fever; Chills; Dizziness; and Sore Throat  Reports 1 day history of dizziness, high fever, sore throat, chills, body aches, headache. Her daughter was recently diagnosed with HFM disease. She is currently breastfeeding. Denies cough, chest pain, shob, sinus pain, tooth pain, vomiting, abdominal pain. 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 Patient is breastfeeding, denies breast pain, nipple discharge. Denies recent tick bites, dysuria, hematuria, urinary frequency.  Leza has a current medication list which includes the following prescription(s): prenatal multivit-min-fe-fa and ibuprofen. Also has No Known Allergies.  Divya  has a past medical history of Medical history non-contributory and neck surgery. Also  has past surgical history that includes Neck surgery.  Objective:   Vitals: BP 122/72 mmHg  Pulse 142  Temp(Src) 103 F (39.4 C) (Oral)  Resp 17  Ht 5' 8.5" (1.74 m)  Wt 136 lb (61.689 kg)  BMI 20.38 kg/m2  SpO2 99%  Breastfeeding? Yes  Temp 102.71F, Pulse 132 on recheck at 18:20 by PA-Kerstin Crusoe s/p IBU , APAP .  Physical Exam  Constitutional: She is oriented to person, place, and time. She appears well-developed and well-nourished.  HENT:  TM's intact bilaterally, no effusions or erythema. Nasal turbinates pink and moist, nasal passages patent. No sinus tenderness. Oropharynx with mild post-nasal drainage and slight erythema but no exudates, tonsillar swelling, mucous membranes moist, dentition in good repair.  Eyes: Right eye exhibits no discharge. Left eye exhibits no discharge. No scleral icterus.  Neck: Normal range of motion. Neck supple.  Cardiovascular: Normal rate, regular rhythm and intact distal pulses.  Exam reveals no gallop and no friction rub.   No murmur  heard. Pulmonary/Chest: No respiratory distress. She has no wheezes. She has no rales.  Lymphadenopathy:    She has no cervical adenopathy.  Neurological: She is alert and oriented to person, place, and time.  Skin: Skin is warm and dry. No rash noted.   Results for orders placed or performed in visit on 11/14/15 (from the past 24 hour(s))  POCT rapid strep A     Status: None   Collection Time: 11/14/15  5:04 PM  Result Value Ref Range   Rapid Strep A Screen Negative Negative  POCT Influenza A/B     Status: None   Collection Time: 11/14/15  5:08 PM  Result Value Ref Range   Influenza A, POC Negative Negative   Influenza B, POC Negative Negative  POCT CBC     Status: Abnormal   Collection Time: 11/14/15  5:47 PM  Result Value Ref Range   WBC 8.4 4.6 - 10.2 K/uL   Lymph, poc 0.7 0.6 - 3.4   POC LYMPH PERCENT 8.3 (A) 10 - 50 %L   MID (cbc) 0.5 0 - 0.9   POC MID % 6.0 0 - 12 %M   POC Granulocyte 7.2 (A) 2 - 6.9   Granulocyte percent 85.7 (A) 37 - 80 %G   RBC 4.85 4.04 - 5.48 M/uL   Hemoglobin 13.4 12.2 - 16.2 g/dL   HCT, POC 78.2 95.6 - 47.9 %   MCV 78.3 (A) 80 - 97 fL   MCH, POC 27.7 27 - 31.2 pg   MCHC 35.4 31.8 - 35.4 g/dL   RDW, POC 21.3 %   Platelet Count, POC 157 142 - 424 K/uL   MPV 7.2 0 -  99.8 fL  POCT urinalysis dipstick     Status: Abnormal   Collection Time: 11/14/15  5:51 PM  Result Value Ref Range   Color, UA yellow yellow   Clarity, UA clear clear   Glucose, UA negative negative   Bilirubin, UA negative negative   Ketones, POC UA negative negative   Spec Grav, UA <=1.005    Blood, UA small (A) negative   pH, UA 7.0    Protein Ur, POC negative negative   Urobilinogen, UA 0.2    Nitrite, UA Negative Negative   Leukocytes, UA Negative Negative  POCT Microscopic Urinalysis ([facility])     Status: Abnormal   Collection Time: 11/14/15  5:51 PM  Result Value Ref Range   WBC,UR,HPF,POC None None WBC/hpf   RBC,UR,HPF,POC None None RBC/hpf   Bacteria None None, Too  numerous to count   Mucus Absent Absent   Epithelial Cells, UR Per Microscopy Few (A) None, Too numerous to count cells/hpf   Assessment and Plan :   This case was precepted with Dr. Katrinka BlazingSmith.   1. Fever, unspecified 2. Sore throat 3. Body aches 4. Dizziness 5. Headache, unspecified headache type - Likely undergoing viral syndrome. Advised supportive care. Use IBU and APAP for sore throat and fevers. Recheck in 2 days if no improvement. F/u with labs by phone.   Wallis BambergMario Amato Sevillano, PA-C [facility] [phone] 11/14/2015 4:27 PM

## 2015-11-14 NOTE — Patient Instructions (Addendum)
High fevers 102F and above alternate between 600-800mg  ibuprofen every 8 hours with food and Tylenol 650mg  every 8 hours.  Low grade fevers less than 102F, alternate between 400mg  ibuprofen every 6 hours with food and Tylenol 500mg  every 6 hours.  If you are not seeing any improvement by Friday, please return to our clinic for recheck.        IF you received an x-ray today, you will receive an invoice from [facility]. Please contact [facility] at [phone] with questions or concerns regarding your invoice.   IF you received labwork today, you will receive an invoice from [facility]. Please contact [facility] at [phone] with questions or concerns regarding your invoice.   Our billing staff will not be able to assist you with questions regarding bills from these companies.  You will be contacted with the lab results as soon as they are available. The fastest way to get your results is to activate your My Chart account. Instructions are located on the last page of this paperwork. If you have not heard from us regarding the results in 2 weeks, please contact this office.

## 2015-11-15 LAB — COMPREHENSIVE METABOLIC PANEL
ALBUMIN: 4.4 g/dL (ref 3.6–5.1)
ALT: 15 U/L (ref 6–29)
AST: 13 U/L (ref 10–30)
Alkaline Phosphatase: 92 U/L (ref 33–115)
BILIRUBIN TOTAL: 0.4 mg/dL (ref 0.2–1.2)
BUN: 10 mg/dL (ref 7–25)
CO2: 22 mmol/L (ref 20–31)
CREATININE: 0.57 mg/dL (ref 0.50–1.10)
Calcium: 9.1 mg/dL (ref 8.6–10.2)
Chloride: 103 mmol/L (ref 98–110)
Glucose, Bld: 93 mg/dL (ref 65–99)
Potassium: 4 mmol/L (ref 3.5–5.3)
SODIUM: 135 mmol/L (ref 135–146)
Total Protein: 7.1 g/dL (ref 6.1–8.1)

## 2015-11-16 LAB — CULTURE, GROUP A STREP: ORGANISM ID, BACTERIA: NORMAL

## 2015-11-20 ENCOUNTER — Telehealth: Payer: Self-pay | Admitting: Urgent Care

## 2015-11-20 NOTE — Telephone Encounter (Signed)
Notified of normal results by VM. She is to rtc if she has no improvement in her fever, dizziness, elevated HR, malaise.

## 2016-06-23 NOTE — L&D Delivery Note (Signed)
CTSP as I walked away from the nursing station, for a precipitous delivery in the bed. RN in attendance.  Delivery Note  SVD viable female Apgars 8,8 over 2nd degree ML Lac.  Placenta delivered spontaneously intact with 3VC. Repair with 2-0 Chromic with good support and hemostasis noted.  R/V exam confirms.  PH art was not done.   Mother and baby to couplet care and are doing well.  EBL 200cc  Candice Campavid Key Cen, MD

## 2017-02-10 ENCOUNTER — Other Ambulatory Visit (HOSPITAL_COMMUNITY): Payer: Self-pay | Admitting: Obstetrics & Gynecology

## 2017-02-10 DIAGNOSIS — Z3689 Encounter for other specified antenatal screening: Secondary | ICD-10-CM

## 2017-02-10 DIAGNOSIS — Z3A19 19 weeks gestation of pregnancy: Secondary | ICD-10-CM

## 2017-02-10 DIAGNOSIS — O283 Abnormal ultrasonic finding on antenatal screening of mother: Secondary | ICD-10-CM

## 2017-02-12 ENCOUNTER — Encounter (HOSPITAL_COMMUNITY): Payer: Self-pay | Admitting: *Deleted

## 2017-02-13 ENCOUNTER — Other Ambulatory Visit (HOSPITAL_COMMUNITY): Payer: Self-pay | Admitting: *Deleted

## 2017-02-13 ENCOUNTER — Encounter (HOSPITAL_COMMUNITY): Payer: Self-pay

## 2017-02-13 ENCOUNTER — Ambulatory Visit (HOSPITAL_COMMUNITY)
Admission: RE | Admit: 2017-02-13 | Discharge: 2017-02-13 | Disposition: A | Discharge status: Home / Self Care | Payer: BC Managed Care – PPO | Source: Ambulatory Visit | Attending: Obstetrics & Gynecology | Admitting: Obstetrics & Gynecology

## 2017-02-13 DIAGNOSIS — Z3A19 19 weeks gestation of pregnancy: Secondary | ICD-10-CM | POA: Diagnosis not present

## 2017-02-13 DIAGNOSIS — Z363 Encounter for antenatal screening for malformations: Secondary | ICD-10-CM | POA: Insufficient documentation

## 2017-02-13 DIAGNOSIS — Z0489 Encounter for examination and observation for other specified reasons: Secondary | ICD-10-CM

## 2017-02-13 DIAGNOSIS — O283 Abnormal ultrasonic finding on antenatal screening of mother: Secondary | ICD-10-CM

## 2017-02-13 DIAGNOSIS — IMO0002 Reserved for concepts with insufficient information to code with codable children: Secondary | ICD-10-CM

## 2017-02-13 DIAGNOSIS — O368920 Maternal care for other specified fetal problems, second trimester, not applicable or unspecified: Secondary | ICD-10-CM | POA: Insufficient documentation

## 2017-02-13 DIAGNOSIS — Z3689 Encounter for other specified antenatal screening: Secondary | ICD-10-CM

## 2017-02-18 ENCOUNTER — Encounter (HOSPITAL_COMMUNITY): Payer: Self-pay

## 2017-02-18 ENCOUNTER — Other Ambulatory Visit (HOSPITAL_COMMUNITY): Payer: Self-pay

## 2017-03-27 ENCOUNTER — Encounter (HOSPITAL_COMMUNITY): Payer: Self-pay

## 2017-03-27 ENCOUNTER — Ambulatory Visit (HOSPITAL_COMMUNITY)
Admission: RE | Admit: 2017-03-27 | Discharge: 2017-03-27 | Disposition: A | Discharge status: Home / Self Care | Payer: BC Managed Care – PPO | Source: Ambulatory Visit | Attending: Obstetrics & Gynecology | Admitting: Obstetrics & Gynecology

## 2017-03-27 DIAGNOSIS — IMO0002 Reserved for concepts with insufficient information to code with codable children: Secondary | ICD-10-CM

## 2017-03-27 DIAGNOSIS — Z3A25 25 weeks gestation of pregnancy: Secondary | ICD-10-CM | POA: Insufficient documentation

## 2017-03-27 DIAGNOSIS — Z362 Encounter for other antenatal screening follow-up: Secondary | ICD-10-CM | POA: Insufficient documentation

## 2017-03-27 DIAGNOSIS — Z0489 Encounter for examination and observation for other specified reasons: Secondary | ICD-10-CM

## 2017-06-15 ENCOUNTER — Inpatient Hospital Stay (HOSPITAL_COMMUNITY): Payer: BC Managed Care – PPO | Admitting: Anesthesiology

## 2017-06-15 ENCOUNTER — Inpatient Hospital Stay (HOSPITAL_COMMUNITY)
Admission: AD | Admit: 2017-06-15 | Discharge: 2017-06-17 | DRG: 807 | Disposition: A | Discharge status: Home / Self Care | Payer: BC Managed Care – PPO | Source: Ambulatory Visit | Attending: Obstetrics and Gynecology | Admitting: Obstetrics and Gynecology

## 2017-06-15 ENCOUNTER — Encounter (HOSPITAL_COMMUNITY): Payer: Self-pay | Admitting: *Deleted

## 2017-06-15 DIAGNOSIS — Z3A37 37 weeks gestation of pregnancy: Secondary | ICD-10-CM | POA: Diagnosis not present

## 2017-06-15 DIAGNOSIS — Z3483 Encounter for supervision of other normal pregnancy, third trimester: Secondary | ICD-10-CM | POA: Diagnosis present

## 2017-06-15 DIAGNOSIS — Z3490 Encounter for supervision of normal pregnancy, unspecified, unspecified trimester: Secondary | ICD-10-CM

## 2017-06-15 LAB — URINALYSIS, ROUTINE W REFLEX MICROSCOPIC
Bilirubin Urine: NEGATIVE
GLUCOSE, UA: NEGATIVE mg/dL
HGB URINE DIPSTICK: NEGATIVE
Ketones, ur: 5 mg/dL — AB
NITRITE: NEGATIVE
PH: 5 (ref 5.0–8.0)
PROTEIN: 30 mg/dL — AB
Specific Gravity, Urine: 1.025 (ref 1.005–1.030)

## 2017-06-15 LAB — CBC
HEMATOCRIT: 36.3 % (ref 36.0–46.0)
HEMOGLOBIN: 12.5 g/dL (ref 12.0–15.0)
MCH: 30.1 pg (ref 26.0–34.0)
MCHC: 34.4 g/dL (ref 30.0–36.0)
MCV: 87.5 fL (ref 78.0–100.0)
Platelets: 176 10*3/uL (ref 150–400)
RBC: 4.15 MIL/uL (ref 3.87–5.11)
RDW: 13.3 % (ref 11.5–15.5)
WBC: 9.8 10*3/uL (ref 4.0–10.5)

## 2017-06-15 LAB — RPR: RPR Ser Ql: NONREACTIVE

## 2017-06-15 LAB — TYPE AND SCREEN
ABO/RH(D): A POS
ANTIBODY SCREEN: POSITIVE
PT AG TYPE: NEGATIVE

## 2017-06-15 MED ORDER — FLEET ENEMA 7-19 GM/118ML RE ENEM: 1.0000 | ENEMA | RECTAL | Status: DC | PRN

## 2017-06-15 MED ORDER — LACTATED RINGERS IV SOLN
INTRAVENOUS | Status: DC
  Administered 2017-06-15: 09:00:00 via INTRAVENOUS

## 2017-06-15 MED ORDER — LIDOCAINE HCL (PF) 1 % IJ SOLN
30.0000 mL | INTRAMUSCULAR | Status: DC | PRN
  Filled 2017-06-15: qty 30

## 2017-06-15 MED ORDER — LACTATED RINGERS IV SOLN: 500.0000 mL | Freq: Once | INTRAVENOUS | Status: DC

## 2017-06-15 MED ORDER — ONDANSETRON HCL 4 MG/2ML IJ SOLN: 4.0000 mg | INTRAMUSCULAR | Status: DC | PRN

## 2017-06-15 MED ORDER — LACTATED RINGERS IV SOLN: 500.0000 mL | INTRAVENOUS | Status: DC | PRN

## 2017-06-15 MED ORDER — SIMETHICONE 80 MG PO CHEW: 80.0000 mg | CHEWABLE_TABLET | ORAL | Status: DC | PRN

## 2017-06-15 MED ORDER — SENNOSIDES-DOCUSATE SODIUM 8.6-50 MG PO TABS
2.0000 | ORAL_TABLET | ORAL | Status: DC
  Administered 2017-06-16 (×2): 2 via ORAL
  Filled 2017-06-15: qty 2

## 2017-06-15 MED ORDER — ACETAMINOPHEN 325 MG PO TABS
650.0000 mg | ORAL_TABLET | ORAL | Status: DC | PRN
  Administered 2017-06-16: 650 mg via ORAL
  Filled 2017-06-15: qty 2

## 2017-06-15 MED ORDER — MEDROXYPROGESTERONE ACETATE 150 MG/ML IM SUSP: 150.0000 mg | INTRAMUSCULAR | Status: DC | PRN

## 2017-06-15 MED ORDER — DIBUCAINE 1 % RE OINT: 1.0000 "application " | TOPICAL_OINTMENT | RECTAL | Status: DC | PRN

## 2017-06-15 MED ORDER — LIDOCAINE HCL (PF) 1 % IJ SOLN
INTRAMUSCULAR | Status: DC | PRN
  Administered 2017-06-15: 13 mL via EPIDURAL

## 2017-06-15 MED ORDER — ZOLPIDEM TARTRATE 5 MG PO TABS: 5.0000 mg | ORAL_TABLET | Freq: Every evening | ORAL | Status: DC | PRN

## 2017-06-15 MED ORDER — EPHEDRINE 5 MG/ML INJ
10.0000 mg | INTRAVENOUS | Status: DC | PRN
  Filled 2017-06-15: qty 2

## 2017-06-15 MED ORDER — ONDANSETRON HCL 4 MG PO TABS: 4.0000 mg | ORAL_TABLET | ORAL | Status: DC | PRN

## 2017-06-15 MED ORDER — IBUPROFEN 600 MG PO TABS
600.0000 mg | ORAL_TABLET | Freq: Four times a day (QID) | ORAL | Status: DC
  Administered 2017-06-15 – 2017-06-17 (×6): 600 mg via ORAL
  Filled 2017-06-15 (×4): qty 1

## 2017-06-15 MED ORDER — PHENYLEPHRINE 40 MCG/ML (10ML) SYRINGE FOR IV PUSH (FOR BLOOD PRESSURE SUPPORT)
80.0000 ug | PREFILLED_SYRINGE | INTRAVENOUS | Status: DC | PRN
  Filled 2017-06-15: qty 5

## 2017-06-15 MED ORDER — ONDANSETRON HCL 4 MG/2ML IJ SOLN: 4.0000 mg | Freq: Four times a day (QID) | INTRAMUSCULAR | Status: DC | PRN

## 2017-06-15 MED ORDER — OXYTOCIN 40 UNITS IN LACTATED RINGERS INFUSION - SIMPLE MED
2.5000 [IU]/h | INTRAVENOUS | Status: DC
  Filled 2017-06-15: qty 1000

## 2017-06-15 MED ORDER — OXYTOCIN BOLUS FROM INFUSION
500.0000 mL | Freq: Once | INTRAVENOUS | Status: AC
  Administered 2017-06-15: 500 mL via INTRAVENOUS

## 2017-06-15 MED ORDER — FENTANYL 2.5 MCG/ML BUPIVACAINE 1/10 % EPIDURAL INFUSION (WH - ANES)
14.0000 mL/h | INTRAMUSCULAR | Status: DC | PRN
  Administered 2017-06-15: 14 mL/h via EPIDURAL

## 2017-06-15 MED ORDER — OXYCODONE-ACETAMINOPHEN 5-325 MG PO TABS
1.0000 | ORAL_TABLET | ORAL | Status: DC | PRN
Start: 2017-06-15 — End: 2017-06-17

## 2017-06-15 MED ORDER — WITCH HAZEL-GLYCERIN EX PADS: 1.0000 "application " | MEDICATED_PAD | CUTANEOUS | Status: DC | PRN

## 2017-06-15 MED ORDER — BENZOCAINE-MENTHOL 20-0.5 % EX AERO
1.0000 "application " | INHALATION_SPRAY | CUTANEOUS | Status: DC | PRN
  Filled 2017-06-15: qty 56

## 2017-06-15 MED ORDER — FENTANYL 2.5 MCG/ML BUPIVACAINE 1/10 % EPIDURAL INFUSION (WH - ANES)
INTRAMUSCULAR | Status: AC
  Filled 2017-06-15: qty 100

## 2017-06-15 MED ORDER — DIPHENHYDRAMINE HCL 50 MG/ML IJ SOLN: 12.5000 mg | INTRAMUSCULAR | Status: DC | PRN

## 2017-06-15 MED ORDER — OXYCODONE-ACETAMINOPHEN 5-325 MG PO TABS: 1.0000 | ORAL_TABLET | ORAL | Status: DC | PRN

## 2017-06-15 MED ORDER — ACETAMINOPHEN 325 MG PO TABS
650.0000 mg | ORAL_TABLET | ORAL | Status: DC | PRN
  Administered 2017-06-15: 650 mg via ORAL
  Filled 2017-06-15: qty 2

## 2017-06-15 MED ORDER — PRENATAL MULTIVITAMIN CH
1.0000 | ORAL_TABLET | Freq: Every day | ORAL | Status: DC
  Administered 2017-06-16: 1 via ORAL
  Filled 2017-06-15: qty 1

## 2017-06-15 MED ORDER — OXYCODONE-ACETAMINOPHEN 5-325 MG PO TABS: 2.0000 | ORAL_TABLET | ORAL | Status: DC | PRN

## 2017-06-15 MED ORDER — PHENYLEPHRINE 40 MCG/ML (10ML) SYRINGE FOR IV PUSH (FOR BLOOD PRESSURE SUPPORT)
PREFILLED_SYRINGE | INTRAVENOUS | Status: AC
  Filled 2017-06-15: qty 10

## 2017-06-15 MED ORDER — SOD CITRATE-CITRIC ACID 500-334 MG/5ML PO SOLN: 30.0000 mL | ORAL | Status: DC | PRN

## 2017-06-15 MED ORDER — DIPHENHYDRAMINE HCL 25 MG PO CAPS: 25.0000 mg | ORAL_CAPSULE | Freq: Four times a day (QID) | ORAL | Status: DC | PRN

## 2017-06-15 MED ORDER — COCONUT OIL OIL: 1.0000 "application " | TOPICAL_OIL | Status: DC | PRN

## 2017-06-15 MED ORDER — MEASLES, MUMPS & RUBELLA VAC ~~LOC~~ INJ
0.5000 mL | INJECTION | Freq: Once | SUBCUTANEOUS | Status: DC
  Filled 2017-06-15: qty 0.5

## 2017-06-15 MED ORDER — TETANUS-DIPHTH-ACELL PERTUSSIS 5-2.5-18.5 LF-MCG/0.5 IM SUSP: 0.5000 mL | Freq: Once | INTRAMUSCULAR | Status: DC

## 2017-06-15 NOTE — Anesthesia Preprocedure Evaluation (Signed)
Anesthesia Evaluation  Patient identified by MRN, date of birth, ID band Patient awake    Reviewed: Allergy & Precautions, H&P , NPO status , Patient's Chart, lab work & pertinent test results  Airway Mallampati: II  TM Distance: >3 FB Neck ROM: full    Dental no notable dental hx. (+) Teeth Intact   Pulmonary neg pulmonary ROS,    Pulmonary exam normal breath sounds clear to auscultation       Cardiovascular negative cardio ROS Normal cardiovascular exam Rhythm:regular Rate:Normal     Neuro/Psych negative neurological ROS  negative psych ROS   GI/Hepatic negative GI ROS, Neg liver ROS, GERD  ,  Endo/Other  negative endocrine ROS  Renal/GU negative Renal ROS  negative genitourinary   Musculoskeletal negative musculoskeletal ROS (+)   Abdominal   Peds negative pediatric ROS (+)  Hematology negative hematology ROS (+)   Anesthesia Other Findings   Reproductive/Obstetrics negative OB ROS (+) Pregnancy                             Anesthesia Physical  Anesthesia Plan  ASA: II  Anesthesia Plan: Epidural   Post-op Pain Management:    Induction:   PONV Risk Score and Plan:   Airway Management Planned:   Additional Equipment:   Intra-op Plan:   Post-operative Plan:   Informed Consent: I have reviewed the patients History and Physical, chart, labs and discussed the procedure including the risks, benefits and alternatives for the proposed anesthesia with the patient or authorized representative who has indicated his/her understanding and acceptance.     Plan Discussed with: Anesthesiologist  Anesthesia Plan Comments:         Anesthesia Quick Evaluation

## 2017-06-15 NOTE — Anesthesia Postprocedure Evaluation (Signed)
Anesthesia Post Note  Patient: Kirsten RiegerMeredith H Odom  Procedure(s) Performed: AN AD HOC LABOR EPIDURAL     Patient location during evaluation: Mother Baby Anesthesia Type: Epidural Level of consciousness: awake and alert Pain management: pain level controlled Vital Signs Assessment: post-procedure vital signs reviewed and stable Respiratory status: spontaneous breathing Cardiovascular status: blood pressure returned to baseline Postop Assessment: no headache, no backache, epidural receding, adequate PO intake, no apparent nausea or vomiting and patient able to bend at knees Anesthetic complications: no    Last Vitals:  Vitals:   06/15/17 1445 06/15/17 1545  BP: 105/63 98/60  Pulse: 90 100  Resp: 18 18  Temp: 36.8 C 36.9 C  SpO2:      Last Pain:  Vitals:   06/15/17 1625  TempSrc:   PainSc: 4    Pain Goal:                 [facility],Nevea Spiewak

## 2017-06-15 NOTE — MAU Provider Note (Signed)
US done for presentation Vertex presentation Longitudinal lie Aviva SignsWilliams, Shenita Trego L, [place]

## 2017-06-15 NOTE — H&P (Signed)
Kirsten Odom is a 31 y.o. female presenting for labor sxs.  Pregnancy uncomplicated.  GBS-. OB History    Gravida Para Term Preterm AB Living   2 1 1     1    SAB TAB Ectopic Multiple Live Births         0 1     Past Medical History:  Diagnosis Date  . Hx of neck surgery   . Medical history non-contributory    Past Surgical History:  Procedure Laterality Date  . NECK SURGERY     Family History: family history includes Diabetes in her maternal grandfather; Heart disease in her maternal grandfather; Hyperlipidemia in her father; Stroke in her maternal grandfather and maternal grandmother. Social History:  reports that  has never smoked. she has never used smokeless tobacco. She reports that she does not drink alcohol or use drugs.     Maternal Diabetes: No Genetic Screening: Normal Maternal Ultrasounds/Referrals: Normal Fetal Ultrasounds or other Referrals:  None Maternal Substance Abuse:  No Significant Maternal Medications:  None Significant Maternal Lab Results:  None Other Comments:  None  ROS History Dilation: 6 Effacement (%): 90 Exam by:: F. Morris, RNC Blood pressure 112/64, pulse (!) 101, temperature 98 F (36.7 Odom), temperature source Oral, resp. rate 20, height 5\' 9"  (1.753 m), weight 152 lb 12 oz (69.3 kg), last menstrual period 09/23/2016, SpO2 100 %, currently breastfeeding. Exam Physical Exam  Prenatal labs: ABO, Rh:   Antibody:   Rubella:   RPR:    HBsAg:    HIV:    GBS:     Assessment/Plan: IUP  At term Active labor. Anticipate SVD   Kirsten Odom 06/15/2017, 9:02 AM

## 2017-06-15 NOTE — Anesthesia Procedure Notes (Signed)
Epidural Patient location during procedure: OB Start time: 06/15/2017 9:37 AM End time: 06/15/2017 9:53 AM  Staffing Anesthesiologist: Lowella CurbMiller, Jamyah Folk Ray, MD Performed: anesthesiologist   Preanesthetic Checklist Completed: patient identified, site marked, surgical consent, pre-op evaluation, timeout performed, IV checked, risks and benefits discussed and monitors and equipment checked  Epidural Patient position: sitting Prep: ChloraPrep Patient monitoring: heart rate, cardiac monitor, continuous pulse ox and blood pressure Approach: midline Location: L2-L3 Injection technique: LOR saline  Needle:  Needle type: Tuohy  Needle gauge: 17 G Needle length: 9 cm Needle insertion depth: 4 cm Catheter type: closed end flexible Catheter size: 20 Guage Catheter at skin depth: 8 cm Test dose: negative  Assessment Events: blood not aspirated, injection not painful, no injection resistance, negative IV test and no paresthesia  Additional Notes Reason for block:procedure for pain

## 2017-06-15 NOTE — Progress Notes (Signed)
Artelia LarocheM. Williams, CNM @ bedside to perform U/S to verify fetal presentation.  Fetus vertex.

## 2017-06-15 NOTE — MAU Note (Signed)
Pt presents with c/o ctxs since 0300 this morning.  Reports ctxs are 3-4 minutes apart.  Denies LOF or VB.  Reports +FM. States cervix 1cm in office last week.

## 2017-06-16 LAB — CBC
HCT: 28.7 % — ABNORMAL LOW (ref 36.0–46.0)
Hemoglobin: 9.9 g/dL — ABNORMAL LOW (ref 12.0–15.0)
MCH: 30 pg (ref 26.0–34.0)
MCHC: 34.5 g/dL (ref 30.0–36.0)
MCV: 87 fL (ref 78.0–100.0)
PLATELETS: 156 10*3/uL (ref 150–400)
RBC: 3.3 MIL/uL — AB (ref 3.87–5.11)
RDW: 13.4 % (ref 11.5–15.5)
WBC: 12.3 10*3/uL — AB (ref 4.0–10.5)

## 2017-06-16 NOTE — Lactation Note (Signed)
This note was copied from a baby's chart. Lactation Consultation Note experienced BF mom BF her 1st child for 18 months. This baby is in NICU for respiratory distress. Mom not allowed to put baby to breast at this time. Mom is using DEBP, collecting small amounts of colostrum. Praised mom for pumping. Discussed milk storage. Gave vials, bottle, and reviewed supply and demand. Mom shown how to use DEBP & how to disassemble, clean, & reassemble parts.Mom knows to pump q3h for 15-20 min.WH/LC brochure given w/resources, support groups and LC services. [facility] Name: [name]: 06/16/2017 Reason for consult: Initial assessment;NICU baby   Maternal Data Has patient been taught Hand Expression?: Yes Does the patient have breastfeeding experience prior to this delivery?: Yes  Feeding Feeding Type: Breast Fed Length of feed: 15 min  LATCH Score Latch: Grasps breast easily, tongue down, lips flanged, rhythmical sucking.  Audible Swallowing: A few with stimulation  Type of Nipple: Everted at rest and after stimulation  Comfort (Breast/Nipple): Soft / non-tender  Hold (Positioning): No assistance needed to correctly position infant at breast.  LATCH Score: 9  Interventions Interventions: DEBP;Breast compression;Breast massage;Hand express;Expressed milk  Lactation Tools Discussed/Used Pump Review: Setup, frequency, and cleaning;Milk Storage Initiated by:: RN Date initiated:: 06/15/17   Consult Status Consult Status: Follow-up Date: 06/16/17 Follow-up type: In-patient    Kirsten Odom, Diamond NickelLAURA G 06/16/2017, 1:29 AM

## 2017-06-16 NOTE — Lactation Note (Signed)
This note was copied from a baby's chart. Lactation Consultation Note  Patient Name: Kirsten Odom ZHYQM'VToday's Date: 06/16/2017 Reason for consult: Follow-up assessment;NICU baby;Early term 37-38.6wks   Follow up with mom of 26 hour old NICU infant. Mom reports infant is doing well. Mom reports she has been able to put infant to breast once. Mom is pumping and getting small volumes of colostrum that she is taking to NICU for the infant. Mom reports she has all she needs right now. Mom to call with any questions/concerns prn.    Maternal Data Has patient been taught Hand Expression?: Yes  Feeding    LATCH Score                   Interventions    Lactation Tools Discussed/Used     Consult Status Consult Status: Follow-up Date: 06/17/17 Follow-up type: In-patient    Silas FloodSharon S Martiza Speth 06/16/2017, 4:04 PM

## 2017-06-16 NOTE — Progress Notes (Signed)
Post Partum Day 1 Subjective: no complaints, up ad lib, voiding and tolerating PO  Objective: Blood pressure 114/73, pulse (!) 103, temperature 98.6 F (37 C), temperature source Oral, resp. rate 18, height 5\' 9"  (1.753 m), weight 152 lb 12 oz (69.3 kg), last menstrual period 09/23/2016, SpO2 99 %, unknown if currently breastfeeding.  Physical Exam:  General: alert, cooperative, appears stated age and no distress Lochia: appropriate Uterine Fundus: firm Incision: healing well DVT Evaluation: No evidence of DVT seen on physical exam.  Recent Labs    06/15/17 0856 06/16/17 0531  HGB 12.5 9.9*  HCT 36.3 28.7*    Assessment/Plan: Plan for discharge tomorrow  Baby went to NICU for breathing issues    LOS: 1 day   Donna Snooks C 06/16/2017, 10:35 AM

## 2017-06-17 MED ORDER — IBUPROFEN 600 MG PO TABS: 600.0000 mg | ORAL_TABLET | Freq: Four times a day (QID) | ORAL | 0 refills | Status: AC

## 2017-06-17 NOTE — Lactation Note (Signed)
This note was copied from a baby's chart. Lactation Consultation Note  Patient Name: Kirsten Odom ZOXWR'UToday's Date: 06/17/2017 Reason for consult: Follow-up assessment;NICU baby;Mother's request;Early term 37-38.6wks   Follow up with mom of 44 hour old NICU infant. Mom is pumping about every 3 hours and obtained 20 cc with last pumping. Enc mom to continue to pump every 2-3 hours at home. Enc mom to use the DEBP in the NICU when visiting infant and her PIS at home. Reviewed switching to the maintenance setting on the pump once obtains > 20 cc for 3 successive pumping sessions. Mom was given storage bottles and has labels to take home.   Mom very tearful and worried how to care for 30 yo at home and visit this infant here. Mom is concerned that infant is not able to be held. Enc mom to visit as she is able and to call for updates as needed. Enc mom to accept help with 2 yo and meals so she can spend more time with her NICU infant.    [facility].    Maternal Data Formula Feeding for Exclusion: No Has patient been taught Hand Expression?: Yes Does the patient have breastfeeding experience prior to this delivery?: Yes  Feeding    LATCH Score                   Interventions    Lactation Tools Discussed/Used WIC Program: No Pump Review: Setup, frequency, and cleaning;Milk Storage   Consult Status Consult Status: PRN Follow-up type: Call as needed    Ed BlalockSharon S Kattaleya Alia 06/17/2017, 9:42 AM

## 2017-06-17 NOTE — Discharge Summary (Signed)
Obstetric Discharge Summary Reason for Admission: onset of labor Prenatal Procedures: ultrasound Intrapartum Procedures: spontaneous vaginal delivery Postpartum Procedures: none Complications-Operative and Postpartum: none Hemoglobin  Date Value Ref Range Status  06/16/2017 9.9 (L) 12.0 - 15.0 g/dL Final    Comment:    DELTA CHECK NOTED REPEATED TO VERIFY    HCT  Date Value Ref Range Status  06/16/2017 28.7 (L) 36.0 - 46.0 % Final    Physical Exam:  General: alert and cooperative Lochia: appropriate Uterine Fundus: firm DVT Evaluation: No evidence of DVT seen on physical exam.  Discharge Diagnoses: Term Pregnancy-delivered  Discharge Information: Date: 06/17/2017 Activity: pelvic rest Diet: routine Medications: PNV and Ibuprofen Condition: stable Instructions: refer to practice specific booklet Discharge to: home Follow-up Information    [place] Schedule an appointment as soon as possible for a visit in 6 week(s).   Contact information: [address] [place] [address] [phone]           Newborn Data: Live born female  Birth Weight:   APGAR: 8, 8  Newborn Delivery   Birth date/time:  06/15/2017 13:27:00 Delivery type:  Vaginal, Spontaneous     Home with mother.  Kirsten CairoGretchen Akaylah Odom 06/17/2017, 9:12 AM

## 2017-06-22 ENCOUNTER — Encounter (HOSPITAL_COMMUNITY): Payer: Self-pay | Admitting: *Deleted

## 2018-01-05 IMAGING — US US MFM OB FOLLOW-UP
1 series · 12 of 28 positions shown · non-contrast
Comparison: none

[Series 1: us mfm ob follow-up · 52 acquisitions, 12 frames shown]
[im 2/52]
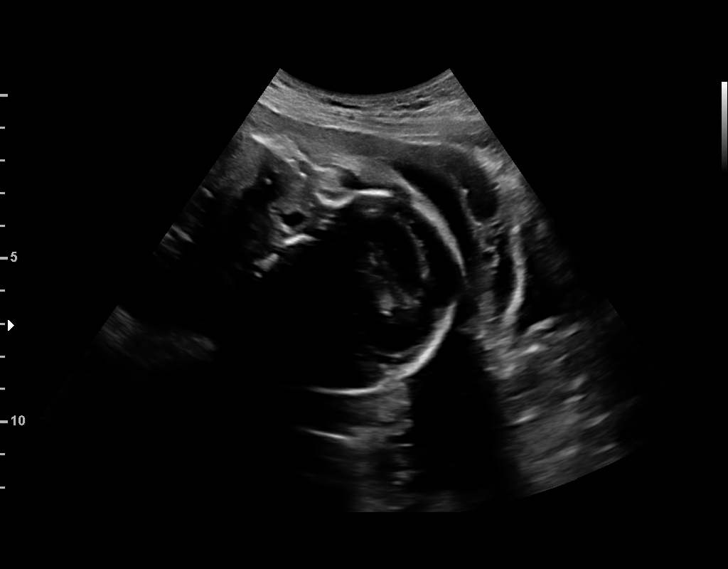
[im 6/52]
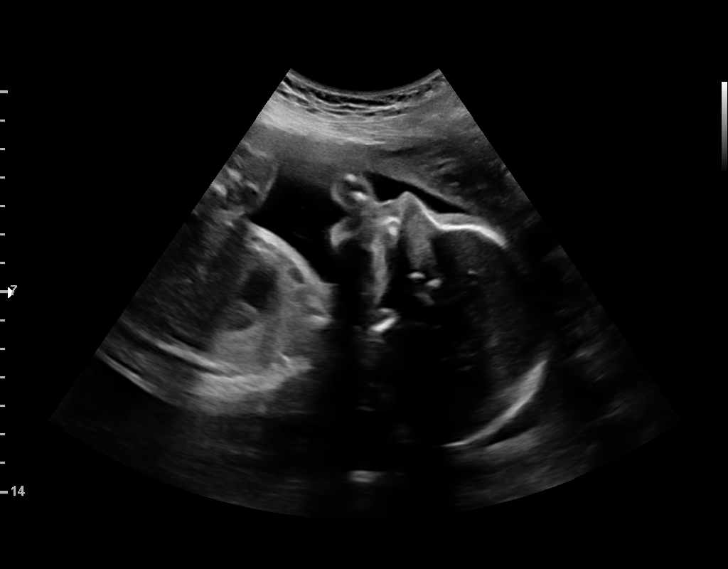
[im 10/52]
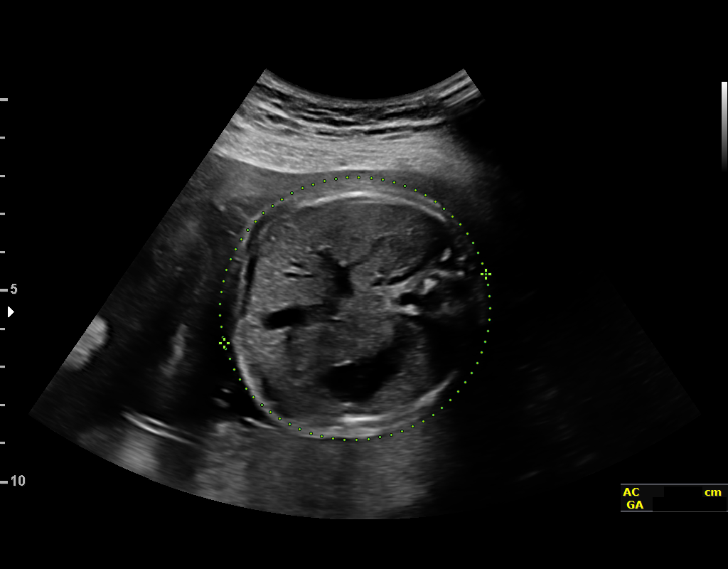
[im 16/52]
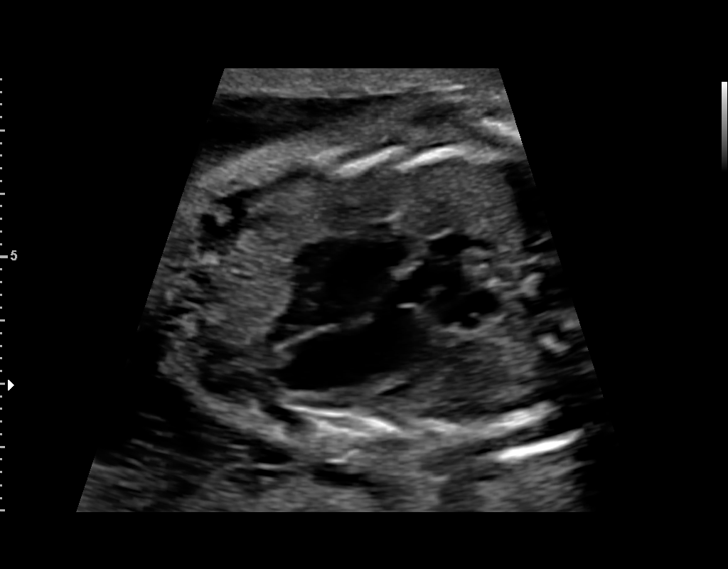
[im 19/52]
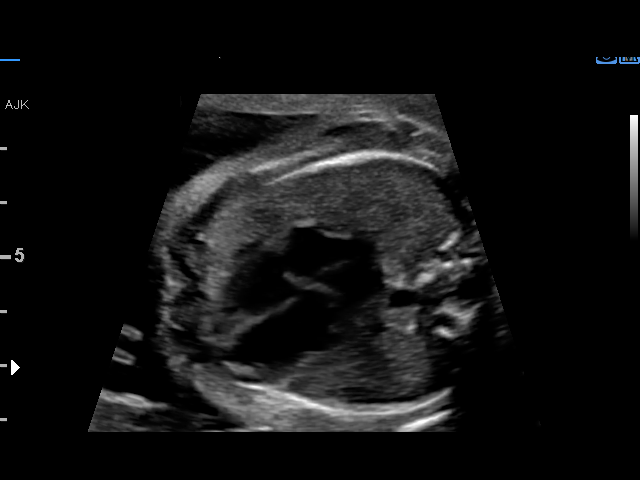
[im 23/52]
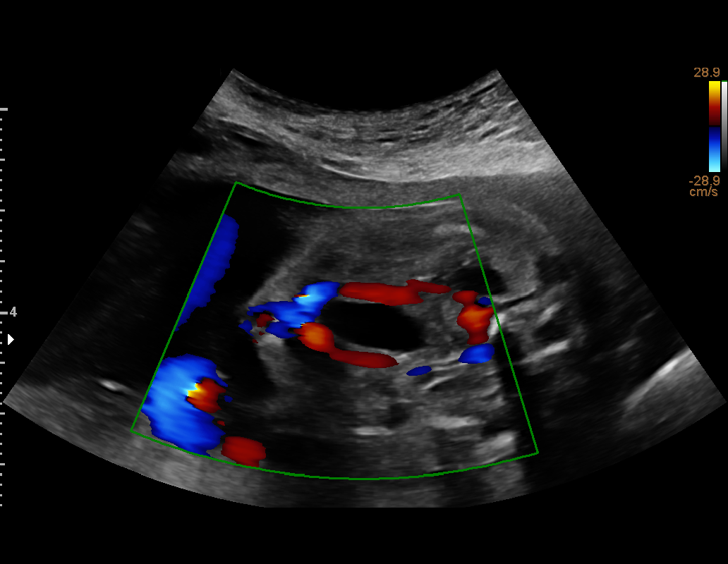
[im 29/52]
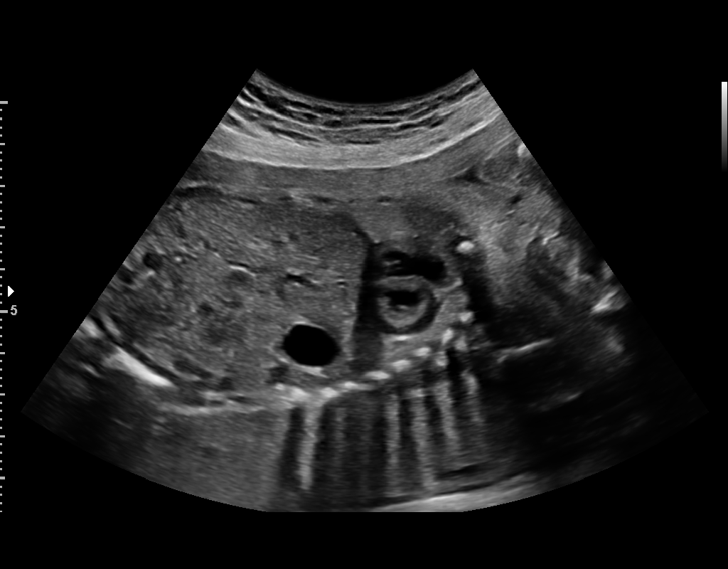
[im 33/52]
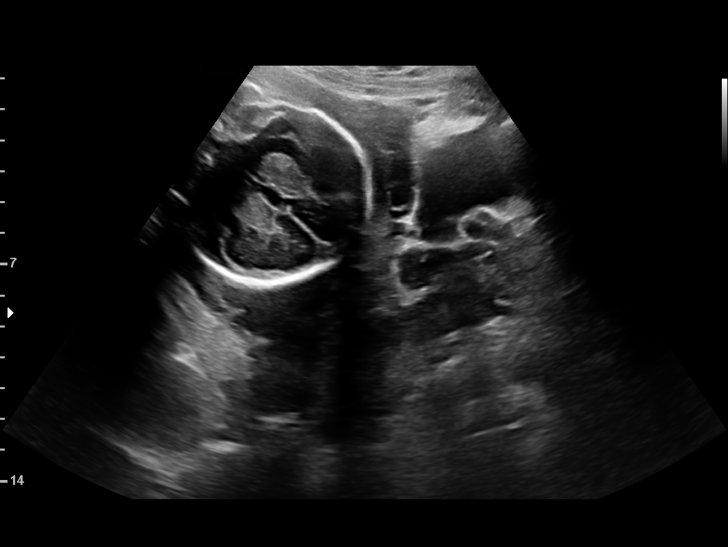
[im 36/52]
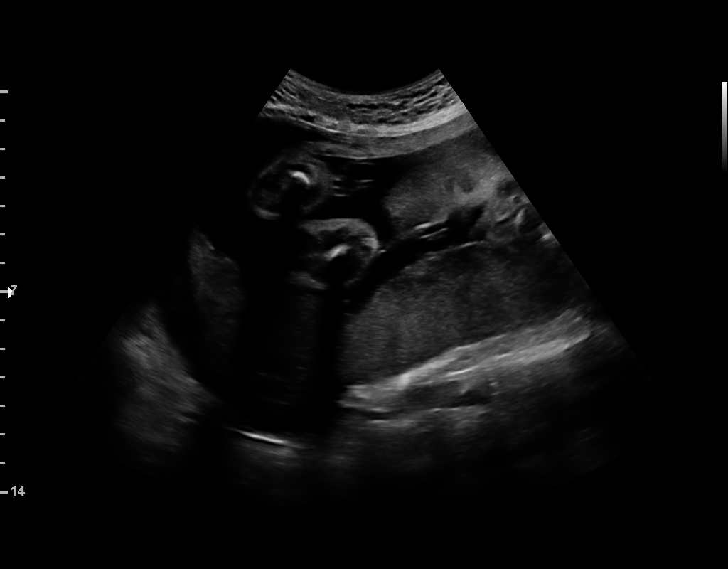
[im 42/52]
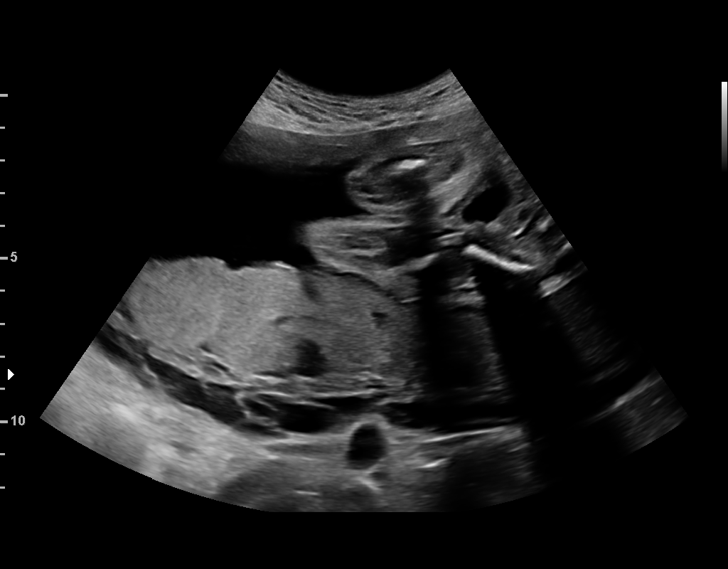
[im 46/52]
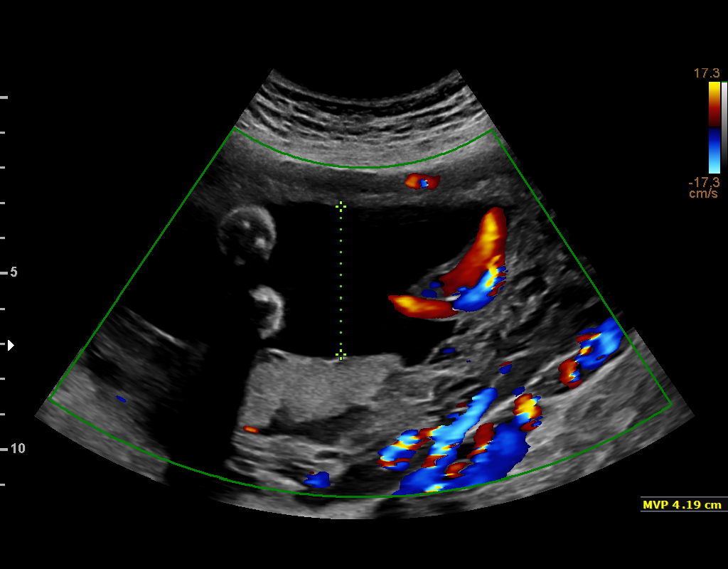
[im 50/52]
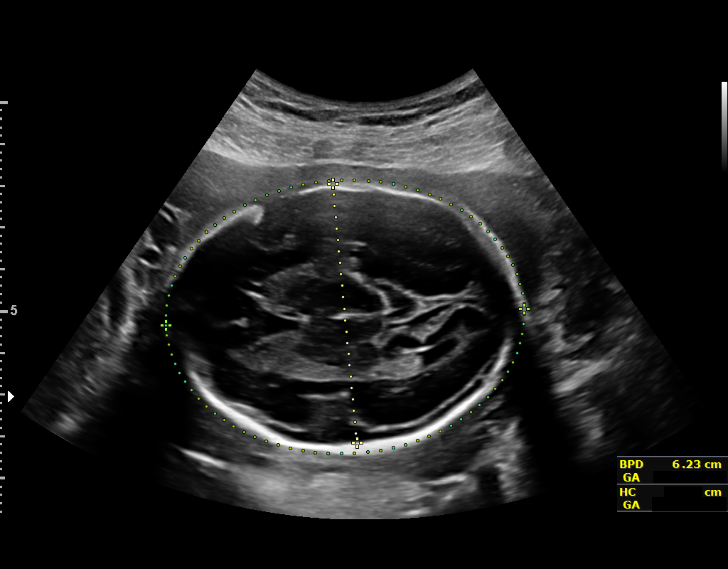

[12 of 28 positions shown; findings below may reference images not displayed]

[REDACTED]. [HOSPITAL]
DO

1  MAYABEL TIGER              [id number]      [id number]     [id number]
Indications

25 weeks gestation of pregnancy
Encounter for other antenatal screening
follow-up
Other umbilical cord
complications,antepartum condition or
complication (Hypocoiled)
OB History

Blood Type:            Height:  5'9"   Weight (lb):  144       BMI:
Gravidity:    2         Term:   1        Prem:   0        SAB:   0
TOP:          0       Ectopic:  0        Living: 1
Fetal Evaluation

Num Of Fetuses:     1
Fetal Heart         144
Rate(bpm):
Cardiac Activity:   Observed
Presentation:       Cephalic
Placenta:           Posterior, above cervical os
P. Cord Insertion:  Visualized, central

Amniotic Fluid
AFI FV:      Subjectively within normal limits

Largest Pocket(cm)
4.19
Biometry

BPD:      62.4  mm     G. Age:  25w 2d         27  %    CI:        71.03   %    70 - 86
FL/HC:      19.7   %    18.6 -
HC:      235.9  mm     G. Age:  25w 4d         24  %    HC/AC:      1.09        1.04 -
AC:      216.5  mm     G. Age:  26w 1d         54  %    FL/BPD:     74.5   %    71 - 87
FL:       46.5  mm     G. Age:  25w 4d         29  %    FL/AC:      21.5   %    20 - 24

Est. FW:     854  gm    1 lb 14 oz      55  %
Gestational Age

LMP:           26w 3d        Date:  09/23/16                 EDD:   06/30/17
U/S Today:     25w 5d                                        EDD:   07/05/17
Best:          25w 5d     Det. By:  Early Ultrasound         EDD:   07/05/17
(11/19/16)
Anatomy

Cranium:               Appears normal         Aortic Arch:            Appears normal
Cavum:                 Appears normal         Ductal Arch:            Appears normal
Ventricles:            Appears normal         Diaphragm:              Appears normal
Choroid Plexus:        Previously seen        Stomach:                Appears normal, left
sided
Cerebellum:            Previously seen        Abdomen:                Appears normal
Posterior Fossa:       Previously seen        Abdominal Wall:         Appears nml (cord
insert, abd wall)
Nuchal Fold:           Previously seen        Cord Vessels:           Variant, see
comments
Face:                  Orbits and profile     Kidneys:                Previously seen
previously seen
Lips:                  Previously seen        Bladder:                Appears normal
Thoracic:              Appears normal         Spine:                  Previously seen
Heart:                 Appears normal         Upper Extremities:      Previously seen
(4CH, axis, and situs
RVOT:                  Appears normal         Lower Extremities:      Previously seen
LVOT:                  Appears normal

Other:  Female gender. Heels and 5th digit previously seen. Technically
difficult due to fetal position.
Cervix Uterus Adnexa

Cervix
Length:            4.4  cm.
Normal appearance by transabdominal scan.

Uterus
No abnormality visualized.

Left Ovary
Within normal limits.

Right Ovary
Within normal limits.

Adnexa:       No abnormality visualized.
Impression

Singleton intrauterine pregnancy at 25 weeks 5 days
gestation with fetal cardiac activity
Cephalic presentatin
Posterior placenta without evidence of previa
Normal appearing fetal growth and amniotic fluid volume
Completion of fetal anatomic survey
Normal appearing cervical length
Recommendations

Findings of today's ultrasound reviewed with the patient.
Discussed that in the setting of 3-vessel cord, normal genetic
screening, normal fetal movement and otherwise normal
anatomy hypocoiled cord is likely a normal variant.
Reviewed fetal movement precautions.
Would recommend growth ultrasounds only if S<D on clinical
exam or additonal indications arise.

## 2019-06-06 ENCOUNTER — Other Ambulatory Visit: Payer: Self-pay

## 2019-06-06 DIAGNOSIS — Z20822 Contact with and (suspected) exposure to covid-19: Secondary | ICD-10-CM

## 2019-06-07 LAB — NOVEL CORONAVIRUS, NAA: SARS-CoV-2, NAA: NOT DETECTED

## 2019-07-07 ENCOUNTER — Ambulatory Visit: Payer: Self-pay | Attending: Internal Medicine

## 2019-07-07 DIAGNOSIS — Z20822 Contact with and (suspected) exposure to covid-19: Secondary | ICD-10-CM

## 2019-08-20 ENCOUNTER — Ambulatory Visit: Payer: Self-pay | Attending: Internal Medicine

## 2019-08-20 DIAGNOSIS — Z23 Encounter for immunization: Secondary | ICD-10-CM | POA: Insufficient documentation

## 2019-08-20 NOTE — Progress Notes (Signed)
   Covid-19 Vaccination Clinic  Name:  Kirsten Odom    MRN: [id number] DOB: April 18, 1986  08/20/2019  [name] was observed post Covid-19 immunization for 15 minutes without incidence. She was provided with Vaccine Information Sheet and instruction to access the V-Safe system.   [name] was instructed to call 911 with any severe reactions post vaccine: Marland Kitchen Difficulty breathing  . Swelling of your face and throat  . A fast heartbeat  . A bad rash all over your body  . Dizziness and weakness    Immunizations Administered    Name Date Dose VIS Date Route   Pfizer COVID-19 Vaccine 08/20/2019  9:20 AM 0.3 mL 06/03/2019 Intramuscular   Manufacturer: [employer], [employer]   Lot: [id number]   NDC: [address]-8

## 2019-09-10 ENCOUNTER — Ambulatory Visit: Payer: Self-pay | Attending: Internal Medicine

## 2019-09-10 DIAGNOSIS — Z23 Encounter for immunization: Secondary | ICD-10-CM

## 2019-09-10 NOTE — Progress Notes (Signed)
   Covid-19 Vaccination Clinic  Name:  Kirsten Odom    MRN: [id number] DOB: July 21, 1985  09/10/2019  [name] was observed post Covid-19 immunization for 15 minutes without incident. She was provided with Vaccine Information Sheet and instruction to access the V-Safe system.   [name] was instructed to call 911 with any severe reactions post vaccine: Marland Kitchen Difficulty breathing  . Swelling of face and throat  . A fast heartbeat  . A bad rash all over body  . Dizziness and weakness   Immunizations Administered    Name Date Dose VIS Date Route   Pfizer COVID-19 Vaccine 09/10/2019 10:49 AM 0.3 mL 06/03/2019 Intramuscular   Manufacturer: [employer], [employer]   Lot: [id number]   NDC: [address]-3

## 2019-09-14 ENCOUNTER — Ambulatory Visit: Payer: Self-pay
